# Patient Record
Sex: Female | Born: 2000 | Race: Black or African American | Hispanic: No | Marital: Single
Health system: Southern US, Community
[De-identification: ages and names within clinical notes are randomized; demographics above are authoritative.]

## PROBLEM LIST (undated history)

## (undated) DIAGNOSIS — Z789 Other specified health status: Secondary | ICD-10-CM

## (undated) HISTORY — PX: HAND SURGERY: SHX662

---
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
## 2020-05-13 ENCOUNTER — Other Ambulatory Visit: Payer: Self-pay

## 2020-05-13 ENCOUNTER — Ambulatory Visit (HOSPITAL_COMMUNITY)
Admission: EM | Admit: 2020-05-13 | Discharge: 2020-05-13 | Disposition: A | Discharge status: Home / Self Care | Payer: Medicaid Other | Attending: Emergency Medicine | Admitting: Emergency Medicine

## 2020-05-13 ENCOUNTER — Encounter (HOSPITAL_COMMUNITY): Payer: Self-pay

## 2020-05-13 DIAGNOSIS — Z3201 Encounter for pregnancy test, result positive: Secondary | ICD-10-CM

## 2020-05-13 DIAGNOSIS — R110 Nausea: Secondary | ICD-10-CM | POA: Diagnosis not present

## 2020-05-13 LAB — POC URINE PREG, ED: Preg Test, Ur: POSITIVE — AB

## 2020-05-13 MED ORDER — PRENATAL COMPLETE 14-0.4 MG PO TABS: 1.0000 | ORAL_TABLET | Freq: Every day | ORAL | 0 refills | Status: DC

## 2020-05-13 MED ORDER — DOXYLAMINE-PYRIDOXINE 10-10 MG PO TBEC: 2.0000 | DELAYED_RELEASE_TABLET | Freq: Every day | ORAL | 0 refills | Status: DC

## 2020-05-13 NOTE — ED Provider Notes (Signed)
[facility]    CSN: [id number] Arrival date & time: 05/13/20  4098      History   Chief Complaint Chief Complaint  Patient presents with  . Possible Pregnancy    HPI Cynthia Beasley is a 19 y.o. female no significant past medical history presenting today for evaluation of pregnancy test.  Patient reports that she has had 2 at home pregnancy test which are positive.  Last menstrual cycle 4/27.  She has had morning sickness.  Denies abdominal pain.  Reports prior miscarriage.  HPI  History reviewed. No pertinent past medical history.  There are no problems to display for this patient.   History reviewed. No pertinent surgical history.  OB History   No obstetric history on file.      Home Medications    Prior to Admission medications   Medication Sig Start Date End Date Taking? Authorizing Provider  Doxylamine-Pyridoxine 10-10 MG TBEC Take 2 tablets by mouth at bedtime. 05/13/20   Tezra Mahr C, PA-C  ibuprofen (ADVIL,MOTRIN) 400 MG tablet Take 1 tablet (400 mg total) by mouth every 6 (six) hours as needed for mild pain. 11/26/13   Marcellina Millin, MD  Prenatal Vit-Fe Fumarate-FA (PRENATAL COMPLETE) 14-0.4 MG TABS Take 1 tablet by mouth daily. 05/13/20   Angelamarie Avakian, Junius Creamer, PA-C    Family History History reviewed. No pertinent family history.  Social History Social History   Tobacco Use  . Smoking status: Passive Smoke Exposure - Never Smoker  . Smokeless tobacco: Never Used  Substance Use Topics  . Alcohol use: Not on file  . Drug use: Yes    Types: Marijuana     Allergies   Patient has no known allergies.   Review of Systems Review of Systems  Constitutional: Negative for fever.  Respiratory: Negative for shortness of breath.   Cardiovascular: Negative for chest pain.  Gastrointestinal: Positive for nausea. Negative for abdominal pain, diarrhea and vomiting.  Genitourinary: Positive for menstrual problem. Negative for dysuria, flank pain,  genital sores, hematuria, vaginal bleeding, vaginal discharge and vaginal pain.  Musculoskeletal: Negative for back pain.  Skin: Negative for rash.  Neurological: Negative for dizziness, light-headedness and headaches.     Physical Exam Triage Vital Signs ED Triage Vitals  Enc Vitals Group     BP 05/13/20 1025 118/69     Pulse Rate 05/13/20 1025 81     Resp 05/13/20 1025 18     Temp 05/13/20 1025 98.6 F (37 C)     Temp Source 05/13/20 1025 Oral     SpO2 05/13/20 1025 100 %     Weight --      Height --      Head Circumference --      Peak Flow --      Pain Score 05/13/20 1024 0     Pain Loc --      Pain Edu? --      Excl. in [place]? --    No data found.  Updated Vital Signs BP 118/69 (BP Location: Left Arm)   Pulse 81   Temp 98.6 F (37 C) (Oral)   Resp 18   LMP 04/15/2020   SpO2 100%   Visual Acuity Right Eye Distance:   Left Eye Distance:   Bilateral Distance:    Right Eye Near:   Left Eye Near:    Bilateral Near:     Physical Exam Vitals and nursing note reviewed.  Constitutional:      Appearance: She is  well-developed.     Comments: No acute distress  HENT:     Head: Normocephalic and atraumatic.     Nose: Nose normal.  Eyes:     Conjunctiva/sclera: Conjunctivae normal.  Cardiovascular:     Rate and Rhythm: Normal rate.  Pulmonary:     Effort: Pulmonary effort is normal. No respiratory distress.  Abdominal:     General: There is no distension.  Musculoskeletal:        General: Normal range of motion.     Cervical back: Neck supple.  Skin:    General: Skin is warm and dry.  Neurological:     Mental Status: She is alert and oriented to person, place, and time.      UC Treatments / Results  Labs (all labs ordered are listed, but only abnormal results are displayed) Labs Reviewed  POC URINE PREG, ED - Abnormal; Notable for the following components:      Result Value   Preg Test, Ur POSITIVE (*)    All other components within normal limits      EKG   Radiology No results found.  Procedures Procedures (including critical care time)  Medications Ordered in UC Medications - No data to display  Initial Impression / Assessment and Plan / UC Course  I have reviewed the triage vital signs and the nursing notes.  Pertinent labs & imaging results that were available during my care of the patient were reviewed by me and considered in my medical decision making (see chart for details).     Pregnancy test positive, initiating on prenatal, provided Diclegis to use for nausea.  Provided contact for OB/GYN follow-up.  If developing pain/bleeding to follow-up at [facility].  Discussed strict return precautions. Patient verbalized understanding and is agreeable with plan.  Final Clinical Impressions(s) / UC Diagnoses   Final diagnoses:  Positive pregnancy test  Nausea     Discharge Instructions     Pregnancy test positive, likely around 4 weeks Begin prenatal Diclegis: Two tablets at bedtime on day 1 and 2; if symptoms persist, take 1 tablet in morning and 2 tablets at bedtime on day 3; if symptoms persist, may increase to 1 tablet in morning, 1 tablet mid-afternoon, and 2 tablets at bedtime on day 4 (maximum: doxylamine 40 mg/pyridoxine 40 mg (4 tablets) per day). OR One-half of the 25 mg Unisom sleep tablet over-the-counter tablet or two chewable 5 mg tablets can be used off-label as an antiemetic. In addition, pyridoxine 25 mg, also available over-the-counter, is taken three or four times per day;This is a reasonable, less expensive substitute for combination tablets.     ED Prescriptions    Medication Sig Dispense Auth. Provider   Prenatal Vit-Fe Fumarate-FA (PRENATAL COMPLETE) 14-0.4 MG TABS Take 1 tablet by mouth daily. 60 tablet Lorian Yaun C, PA-C   Doxylamine-Pyridoxine 10-10 MG TBEC Take 2 tablets by mouth at bedtime. 60 tablet Camila Norville, [place] C, PA-C     PDMP not reviewed this encounter.    Janith Lima, PA-C 05/13/20 1147

## 2020-05-13 NOTE — ED Triage Notes (Signed)
Pt presents for pregnancy test.  

## 2020-05-13 NOTE — Discharge Instructions (Signed)
Pregnancy test positive, likely around 4 weeks Begin prenatal Diclegis: Two tablets at bedtime on day 1 and 2; if symptoms persist, take 1 tablet in morning and 2 tablets at bedtime on day 3; if symptoms persist, may increase to 1 tablet in morning, 1 tablet mid-afternoon, and 2 tablets at bedtime on day 4 (maximum: doxylamine 40 mg/pyridoxine 40 mg (4 tablets) per day). OR One-half of the 25 mg Unisom sleep tablet over-the-counter tablet or two chewable 5 mg tablets can be used off-label as an antiemetic. In addition, pyridoxine 25 mg, also available over-the-counter, is taken three or four times per day;This is a reasonable, less expensive substitute for combination tablets.

## 2020-05-28 ENCOUNTER — Ambulatory Visit (HOSPITAL_COMMUNITY)
Admission: EM | Admit: 2020-05-28 | Discharge: 2020-05-28 | Disposition: A | Discharge status: Home / Self Care | Payer: Medicaid Other | Attending: Family Medicine | Admitting: Family Medicine

## 2020-05-28 ENCOUNTER — Encounter (HOSPITAL_COMMUNITY): Payer: Self-pay

## 2020-05-28 ENCOUNTER — Other Ambulatory Visit: Payer: Self-pay

## 2020-05-28 DIAGNOSIS — M546 Pain in thoracic spine: Secondary | ICD-10-CM
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
## 2020-05-28 NOTE — Discharge Instructions (Signed)
Please try heat  Please try tylenol  Please try the exercises  Please follow up if your symptoms fail to improve.

## 2020-06-27 ENCOUNTER — Other Ambulatory Visit: Payer: Self-pay

## 2020-06-27 ENCOUNTER — Ambulatory Visit (INDEPENDENT_AMBULATORY_CARE_PROVIDER_SITE_OTHER): Payer: Medicaid Other

## 2020-06-27 VITALS — BP 110/74 | HR 91 | Ht 63.0 in | Wt 124.4 lb

## 2020-06-27 DIAGNOSIS — O3680X0 Pregnancy with inconclusive fetal viability, not applicable or unspecified: Secondary | ICD-10-CM

## 2020-06-27 DIAGNOSIS — O219 Vomiting of pregnancy, unspecified: Secondary | ICD-10-CM

## 2020-06-27 DIAGNOSIS — Z3490 Encounter for supervision of normal pregnancy, unspecified, unspecified trimester: Secondary | ICD-10-CM

## 2020-06-27 DIAGNOSIS — Z3A10 10 weeks gestation of pregnancy: Secondary | ICD-10-CM | POA: Diagnosis not present

## 2020-06-27 MED ORDER — DOXYLAMINE-PYRIDOXINE 10-10 MG PO TBEC
DELAYED_RELEASE_TABLET | ORAL | 6 refills | Status: DC
Start: 2020-06-27 — End: 2020-12-23

## 2020-06-27 MED ORDER — BLOOD PRESSURE MONITOR KIT: 1.0000 | PACK | 0 refills | Status: DC

## 2020-06-27 NOTE — Progress Notes (Signed)
I have reviewed this chart and agree with the RN/CMA assessment and management.    K. Meryl Trevyn Lumpkin, M.D. Attending [facility] (Faculty Practice)   

## 2020-06-27 NOTE — Progress Notes (Signed)
.       Subjective:   Cynthia Beasley is a 19 y.o. G1P0 at [redacted]w[redacted]d by LMP being seen today for her first obstetrical visit.  Her obstetrical history is significant for N/A. Patient does intend to breast feed. Pregnancy history fully reviewed.  Patient reports nausea and vomiting. Patient has no other concerns. Per Dr. Earlene Plater okay to send Diclegis to the pharmacy. Rx sent  HISTORY: OB History  Gravida Para Term Preterm AB Living  1 0 0 0 0 0  SAB TAB Ectopic Multiple Live Births  0 0 0 0 0    # Outcome Date GA Lbr Len/2nd Weight Sex Delivery Anes PTL Lv  1 Current            No past medical history on file. No past surgical history on file. Family History  Problem Relation Age of Onset  . Healthy Mother   . Healthy Father    Social History   Tobacco Use  . Smoking status: Passive Smoke Exposure - Never Smoker  . Smokeless tobacco: Never Used  Substance Use Topics  . Alcohol use: Never  . Drug use: Yes    Types: Marijuana   No Known Allergies Current Outpatient Medications on File Prior to Visit  Medication Sig Dispense Refill  . Prenatal 27-1 MG TABS Take 1 tablet by mouth daily.     No current facility-administered medications on file prior to visit.     Exam   Vitals:   06/27/20 0901  BP: 110/74  Pulse: 91  Weight: 124 lb 6.4 oz (56.4 kg)  Height: 5\' 3"  (1.6 m)      Uterus:     Pelvic Exam: Perineum: no hemorrhoids, normal perineum   Vulva: normal external genitalia, no lesions   Vagina:  normal mucosa, normal discharge   Cervix: no lesions and normal, pap smear done.    Adnexa: normal adnexa and no mass, fullness, tenderness   Bony Pelvis: average  System: General: well-developed, well-nourished female in no acute distress   Breast:  normal appearance, no masses or tenderness   Skin: normal coloration and turgor, no rashes   Neurologic: oriented, normal, negative, normal mood   Extremities: normal strength, tone, and muscle mass, ROM of all joints is  normal   HEENT PERRLA, extraocular movement intact and sclera clear, anicteric   Mouth/Teeth mucous membranes moist, pharynx normal without lesions and dental hygiene good   Neck supple and no masses   Cardiovascular: regular rate and rhythm   Respiratory:  no respiratory distress, normal breath sounds   Abdomen: soft, non-tender; bowel sounds normal; no masses,  no organomegaly     Assessment:   Pregnancy: G1P0 Patient Active Problem List   Diagnosis Date Noted  . Encounter for supervision of normal pregnancy, unspecified, unspecified trimester 06/27/2020     Plan:  1. Encounter for supervision of normal pregnancy, antepartum, unspecified gravidity  U/S today reveals single live intrauterine [redacted]w[redacted]d pregnancy. FHR 166

## 2020-06-30 ENCOUNTER — Telehealth: Payer: Self-pay | Admitting: Licensed Clinical Social Worker

## 2020-06-30 NOTE — Telephone Encounter (Signed)
Left message regarding scheduled appt reminder

## 2020-08-04 ENCOUNTER — Other Ambulatory Visit: Payer: Self-pay

## 2020-08-04 ENCOUNTER — Ambulatory Visit (INDEPENDENT_AMBULATORY_CARE_PROVIDER_SITE_OTHER): Payer: Medicaid Other | Admitting: Advanced Practice Midwife

## 2020-08-04 VITALS — BP 124/77 | HR 94 | Wt 119.2 lb

## 2020-08-04 DIAGNOSIS — Z3A15 15 weeks gestation of pregnancy: Secondary | ICD-10-CM | POA: Diagnosis not present

## 2020-08-04 DIAGNOSIS — Z3482 Encounter for supervision of other normal pregnancy, second trimester: Secondary | ICD-10-CM | POA: Diagnosis not present

## 2020-08-04 DIAGNOSIS — O219 Vomiting of pregnancy, unspecified: Secondary | ICD-10-CM

## 2020-08-04 DIAGNOSIS — Z3A19 19 weeks gestation of pregnancy: Secondary | ICD-10-CM

## 2020-08-04 DIAGNOSIS — Z3400 Encounter for supervision of normal first pregnancy, unspecified trimester: Secondary | ICD-10-CM

## 2020-08-04 MED ORDER — BLOOD PRESSURE KIT DEVI: 1.0000 | 0 refills | Status: DC | PRN

## 2020-08-04 MED ORDER — ASPIRIN EC 81 MG PO TBEC: 81.0000 mg | DELAYED_RELEASE_TABLET | Freq: Every day | ORAL | 11 refills | Status: DC

## 2020-08-04 NOTE — Patient Instructions (Signed)

## 2020-08-04 NOTE — Progress Notes (Signed)
NOB in office, states not feeling fetal movement yet, has occasional cramps. Intake and U/S completed on 06-28-19

## 2020-08-04 NOTE — Progress Notes (Signed)
Subjective:   Jakya Dovidio is a 19 y.o. G1P0 at 62w6dby LMP being seen today for her first obstetrical visit.  Her obstetrical history is significant for none and has Encounter for supervision of normal pregnancy, unspecified, unspecified trimester on their problem list.. Patient does intend to breast feed. Pregnancy history fully reviewed.  Patient reports nausea.  HISTORY: OB History  Gravida Para Term Preterm AB Living  1 0 0 0 0 0  SAB TAB Ectopic Multiple Live Births  0 0 0 0 0    # Outcome Date GA Lbr Len/2nd Weight Sex Delivery Anes PTL Lv  1 Current            No past medical history on file. Past Surgical History:  Procedure Laterality Date  . HAND SURGERY Left    Family History  Problem Relation Age of Onset  . Healthy Mother   . Healthy Father    Social History   Tobacco Use  . Smoking status: Passive Smoke Exposure - Never Smoker  . Smokeless tobacco: Never Used  Vaping Use  . Vaping Use: Never used  Substance Use Topics  . Alcohol use: Never  . Drug use: Not Currently    Types: Marijuana    Comment: last used 2 months ago   No Known Allergies Current Outpatient Medications on File Prior to Visit  Medication Sig Dispense Refill  . Doxylamine-Pyridoxine (DICLEGIS) 10-10 MG TBEC Take 2 tabs qhs then add 1 tab A.M and midday prn 90 tablet 6  . Prenatal 27-1 MG TABS Take 1 tablet by mouth daily.    . Blood Pressure Monitor KIT 1 kit by Does not apply route once a week. 1 kit 0   No current facility-administered medications on file prior to visit.    Indications for ASA therapy (per uptodate) One of the following: Previous pregnancy with preeclampsia, especially early onset and with an adverse outcome No Multifetal gestation No Chronic hypertension No Type 1 or 2 diabetes mellitus No Chronic kidney disease No Autoimmune disease (antiphospholipid syndrome, systemic lupus erythematosus) No  Two or more of the following: Nulliparity Yes Obesity  (body mass index >30 kg/m2) No Family history of preeclampsia in mother or sister No Age ?35 years No Sociodemographic characteristics (African American race, low socioeconomic level) Yes Personal risk factors (eg, previous pregnancy with low birth weight or small for gestational age infant, previous adverse pregnancy outcome [eg, stillbirth], interval >10 years between pregnancies) No   Indications for early 1 hour GTT (per uptodate)  BMI >25 (>23 in Asian women): No BMI 21  Exam   Vitals:   08/04/20 1309  BP: 124/77  Pulse: 94  Weight: 119 lb 3.2 oz (54.1 kg)   Fetal Heart Rate (bpm): 158  Uterus:     Pelvic Exam: Perineum: no hemorrhoids, normal perineum   Vulva: normal external genitalia, no lesions   Vagina:  normal mucosa, normal discharge   Cervix: no lesions and normal, pap smear done.    Adnexa: normal adnexa and no mass, fullness, tenderness   Bony Pelvis: average  System: General: well-developed, well-nourished female in no acute distress   Breast:  normal appearance, no masses or tenderness   Skin: normal coloration and turgor, no rashes   Neurologic: oriented, normal, negative, normal mood   Extremities: normal strength, tone, and muscle mass, ROM of all joints is normal   HEENT PERRLA, extraocular movement intact and sclera clear, anicteric   Mouth/Teeth mucous membranes moist, pharynx  normal without lesions and dental hygiene good   Neck supple and no masses   Cardiovascular: regular rate and rhythm   Respiratory:  no respiratory distress, normal breath sounds   Abdomen: soft, non-tender; bowel sounds normal; no masses,  no organomegaly     Assessment:   Pregnancy: G1P0 Patient Active Problem List   Diagnosis Date Noted  . Encounter for supervision of normal pregnancy, unspecified, unspecified trimester 06/27/2020     Plan:  1. Supervision of normal first pregnancy, antepartum --Anticipatory guidance about next visits/weeks of pregnancy given. --Next  visit in 4 weeks, virtual  --Pt meets criteria to start Baby ASA to prevent preeclampsia, discussed today and Rx sent - Culture, OB Urine - Cervicovaginal ancillary only( [place]) - Obstetric Panel, Including HIV - Hepatitis C Antibody - Genetic Screening - US MFM OB COMP + 46 WK; Future - aspirin EC 81 MG tablet; Take 1 tablet (81 mg total) by mouth daily. Swallow whole.  Dispense: 30 tablet; Refill: 11 - AFP, Serum, Open Spina Bifida  2. Nausea and vomiting during pregnancy prior to [redacted] weeks gestation --Improved, denies need for treatment at this time.  3. [redacted] weeks gestation of pregnancy   Initial labs drawn. Continue prenatal vitamins. Discussed and offered genetic screening options, including Quad screen/AFP, NIPS testing, and option to decline testing. Benefits/risks/alternatives reviewed. Pt aware that anatomy US is form of genetic screening with lower accuracy in detecting trisomies than blood work.  Pt chooses genetic screening today. NIPS: ordered. Ultrasound discussed; fetal anatomic survey: ordered. Problem list reviewed and updated. The nature of [place] with multiple MDs and other Advanced Practice Providers was explained to patient; also emphasized that residents, students are part of our team. Routine obstetric precautions reviewed. Return in about 4 weeks (around 09/01/2020).   Fatima Blank, CNM 08/04/20 1:41 PM

## 2020-08-05 ENCOUNTER — Ambulatory Visit: Payer: Medicaid Other

## 2020-08-05 LAB — OBSTETRIC PANEL, INCLUDING HIV
Antibody Screen: NEGATIVE
Basophils Absolute: 0 10*3/uL (ref 0.0–0.2)
Basos: 0 %
EOS (ABSOLUTE): 0.1 10*3/uL (ref 0.0–0.4)
Eos: 1 %
HIV Screen 4th Generation wRfx: NONREACTIVE
Hematocrit: 38.9 % (ref 34.0–46.6)
Hemoglobin: 12.8 g/dL (ref 11.1–15.9)
Hepatitis B Surface Ag: NEGATIVE
Immature Grans (Abs): 0 10*3/uL (ref 0.0–0.1)
Immature Granulocytes: 0 %
Lymphocytes Absolute: 1.4 10*3/uL (ref 0.7–3.1)
Lymphs: 23 %
MCH: 28.6 pg (ref 26.6–33.0)
MCHC: 32.9 g/dL (ref 31.5–35.7)
MCV: 87 fL (ref 79–97)
Monocytes Absolute: 0.5 10*3/uL (ref 0.1–0.9)
Monocytes: 8 %
Neutrophils Absolute: 4.2 10*3/uL (ref 1.4–7.0)
Neutrophils: 68 %
Platelets: 265 10*3/uL (ref 150–450)
RBC: 4.48 x10E6/uL (ref 3.77–5.28)
RDW: 12.9 % (ref 11.7–15.4)
RPR Ser Ql: NONREACTIVE
Rh Factor: NEGATIVE
Rubella Antibodies, IGG: <0.9 index — ABNORMAL LOW (ref >0.99)
WBC: 6.2 10*3/uL (ref 3.4–10.8)

## 2020-08-05 LAB — HEPATITIS C ANTIBODY: Hep C Virus Ab: <0.1 s/co ratio (ref 0.0–0.9)

## 2020-08-06 LAB — AFP, SERUM, OPEN SPINA BIFIDA
AFP MoM: 0.96
AFP Value: 41.8 ng/mL
Gest. Age on Collection Date: 15.9 weeks
Maternal Age At EDD: 19.6 yr
OSBR Risk 1 IN: 10000
Test Results:: NEGATIVE
Weight: 119 [lb_av]

## 2020-08-08 LAB — CULTURE, OB URINE

## 2020-08-08 LAB — URINE CULTURE, OB REFLEX

## 2020-08-11 ENCOUNTER — Encounter: Payer: Self-pay | Admitting: Advanced Practice Midwife

## 2020-08-13 ENCOUNTER — Encounter: Payer: Self-pay | Admitting: Advanced Practice Midwife

## 2020-08-16 ENCOUNTER — Encounter: Payer: Self-pay | Admitting: Advanced Practice Midwife

## 2020-08-16 DIAGNOSIS — D563 Thalassemia minor: Secondary | ICD-10-CM | POA: Insufficient documentation

## 2020-08-28 ENCOUNTER — Ambulatory Visit: Payer: Medicaid Other | Attending: Advanced Practice Midwife

## 2020-09-01 ENCOUNTER — Telehealth: Payer: Self-pay

## 2020-09-01 ENCOUNTER — Telehealth (INDEPENDENT_AMBULATORY_CARE_PROVIDER_SITE_OTHER): Payer: Medicaid Other | Admitting: Nurse Practitioner

## 2020-09-01 DIAGNOSIS — Z3400 Encounter for supervision of normal first pregnancy, unspecified trimester: Secondary | ICD-10-CM

## 2020-09-01 DIAGNOSIS — Z3A19 19 weeks gestation of pregnancy: Secondary | ICD-10-CM

## 2020-09-01 MED ORDER — BLOOD PRESSURE KIT DEVI: 1.0000 | 0 refills | Status: DC | PRN

## 2020-09-01 NOTE — Telephone Encounter (Signed)
Called pt to advise about BP cuff, babyrx, and how to re-schedule U/S. No answer, left vm to call.

## 2020-09-01 NOTE — Progress Notes (Signed)
S/w pt for virtual visit. Pt reports fetal movement, denies pain, states that she has not picked up BP cuff yet.

## 2020-09-01 NOTE — Progress Notes (Signed)
I connected with Cynthia Beasley 09/01/20 at  1:00 PM EDT by: MyChart video and verified that I am speaking with the correct person using two identifiers.  Patient is located at home and provider is located at [facility].     The purpose of this virtual visit is to provide medical care while limiting exposure to the novel coronavirus. I discussed the limitations, risks, security and privacy concerns of performing an evaluation and management service by MyChart video and the availability of in person appointments. I also discussed with the patient that there may be a patient responsible charge related to this service. By engaging in this virtual visit, you consent to the provision of healthcare.  Additionally, you authorize for your insurance to be billed for the services provided during this visit.  The patient expressed understanding and agreed to proceed.  The following staff members participated in the virtual visit:  [place], RN and Nolene Bernheim, NP    PRENATAL VISIT NOTE  Subjective:  Cynthia Beasley is a 19 y.o. G1P0 at [redacted]w[redacted]d  for phone visit for ongoing prenatal care.  She is currently monitored for the following issues for this low-risk pregnancy and has Encounter for supervision of normal pregnancy, unspecified, unspecified trimester and Alpha thalassemia silent carrier on their problem list.  Patient reports no complaints.  Contractions: Not present. Vag. Bleeding: None.  Movement: Present. Denies leaking of fluid.   The following portions of the patient's history were reviewed and updated as appropriate: allergies, current medications, past family history, past medical history, past social history, past surgical history and problem list.   Objective:  There were no vitals filed for this visit. Self-Obtained  Fetal Status:     Movement: Present     Assessment and Plan:  Pregnancy: G1P0 at [redacted]w[redacted]d 1. Supervision of normal first pregnancy, antepartum Missed anatomy scan - did not  realize she had an appointment Message in check out note to reschedule anatomy US Will bring to office for next visit - does not have BP cuff or Babyscripts  - asked RN to assist client Interested in waterbirth but also recommended signing up for childbirth, breastfeeding and waterbirth class. RN to call her to help her get onto Babyscripts and review where to pick up BP cuff.  2. [redacted] weeks gestation of pregnancy  Preterm labor symptoms and general obstetric precautions including but not limited to vaginal bleeding, contractions, leaking of fluid and fetal movement were reviewed in detail with the patient.  Return in about 4 weeks (around 09/29/2020) for in person ROB - no BP cuff.  No future appointments.   Time spent on virtual visit: 8 minutes  Currie Paris, NP

## 2020-09-01 NOTE — Patient Instructions (Signed)
[email or web address] for classes 

## 2020-09-12 ENCOUNTER — Other Ambulatory Visit: Payer: Medicaid Other

## 2020-09-15 ENCOUNTER — Other Ambulatory Visit: Payer: Medicaid Other

## 2020-09-29 ENCOUNTER — Ambulatory Visit (INDEPENDENT_AMBULATORY_CARE_PROVIDER_SITE_OTHER): Payer: Medicaid Other | Admitting: Advanced Practice Midwife

## 2020-09-29 ENCOUNTER — Other Ambulatory Visit: Payer: Self-pay

## 2020-09-29 VITALS — BP 117/71 | HR 102 | Wt 130.4 lb

## 2020-09-29 DIAGNOSIS — Z3400 Encounter for supervision of normal first pregnancy, unspecified trimester: Secondary | ICD-10-CM

## 2020-09-29 DIAGNOSIS — Z3A23 23 weeks gestation of pregnancy: Secondary | ICD-10-CM

## 2020-09-29 DIAGNOSIS — Z23 Encounter for immunization: Secondary | ICD-10-CM | POA: Diagnosis not present

## 2020-09-29 NOTE — Progress Notes (Signed)
Patient presents for ROB. She has no concerns today. Flu vaccine given.

## 2020-09-29 NOTE — Progress Notes (Signed)
   PRENATAL VISIT NOTE  Subjective:  Cynthia Beasley is a 19 y.o. G1P0 at [redacted]w[redacted]d being seen today for ongoing prenatal care.  She is currently monitored for the following issues for this low-risk pregnancy and has Encounter for supervision of normal pregnancy, unspecified, unspecified trimester and Alpha thalassemia silent carrier on their problem list.  Patient reports no complaints.  Contractions: Not present. Vag. Bleeding: None.  Movement: Present. Denies leaking of fluid.   The following portions of the patient's history were reviewed and updated as appropriate: allergies, current medications, past family history, past medical history, past social history, past surgical history and problem list.   Objective:   Vitals:   09/29/20 0946  BP: 117/71  Pulse: (!) 102  Weight: 130 lb 6.4 oz (59.1 kg)    Fetal Status: Fetal Heart Rate (bpm): 145   Movement: Present     General:  Alert, oriented and cooperative. Patient is in no acute distress.  Skin: Skin is warm and dry. No rash noted.   Cardiovascular: Normal heart rate noted  Respiratory: Normal respiratory effort, no problems with respiration noted  Abdomen: Soft, gravid, appropriate for gestational age.  Pain/Pressure: Absent     Pelvic: Cervical exam deferred        Extremities: Normal range of motion.  Edema: None  Mental Status: Normal mood and affect. Normal behavior. Normal judgment and thought content.   Assessment and Plan:  Pregnancy: G1P0 at [redacted]w[redacted]d 1. Supervision of normal first pregnancy, antepartum --Anticipatory guidance about next visits/weeks of pregnancy given. --Interest in waterbirth, discussed, questions answered.  Pt to sign up for class. Recommend childbirth classes as well.  - Flu Vaccine QUAD 36+ mos IM (Fluarix, Quad PF)  2. [redacted] weeks gestation of pregnancy   Preterm labor symptoms and general obstetric precautions including but not limited to vaginal bleeding, contractions, leaking of fluid and fetal  movement were reviewed in detail with the patient. Please refer to After Visit Summary for other counseling recommendations.   Return in about 4 weeks (around 10/27/2020).  No future appointments.  Sharen Counter, CNM

## 2020-09-29 NOTE — Patient Instructions (Addendum)
Considering Waterbirth? Guide for patients at [facility] Why consider waterbirth? . Gentle birth for babies  . Less pain medicine used in labor  . May allow for passive descent/less pushing  . May reduce perineal tears  . More mobility and instinctive maternal position changes  . Increased maternal relaxation  . Reduced blood pressure in labor   Is waterbirth safe? What are the risks of infection, drowning or other complications? . Infection:  . Very low risk (3.7 % for tub vs 4.8% for bed)  . 7 in 8000 waterbirths with documented infection  . Poorly cleaned equipment most common cause  . Slightly lower group B strep transmission rate  . Drowning  . Maternal:  . Very low risk  . Related to seizures or fainting  . Newborn:  . Very low risk. No evidence of increased risk of respiratory problems in multiple large studies  . Physiological protection from breathing under water  . Avoid underwater birth if there are any fetal complications  . Once baby's head is out of the water, keep it out.  . Birth complication  . Some reports of cord trauma, but risk decreased by bringing baby to surface gradually  . No evidence of increased risk of shoulder dystocia. Mothers can usually change positions faster in water than in a bed, possibly aiding the maneuvers to free the shoulder.  ? You must attend a Waterbirth class at [facility] [place] . 3rd Wednesday of every month from 7-9pm  . Free  . Register by calling [phone] or online at [email or web address]  . Bring us the certificate from the class to your prenatal appointment  Meet with a midwife at 36 weeks to see if you can still plan a waterbirth and to sign the consent.   If you plan a waterbirth at [facility], the following purchases are optional: . [employer] . Bathing suit top  . Long-handled mirror  .  Things that would prevent you from having a  waterbirth: . Unknown or Positive COVID-19 diagnosis upon admission to hospital  . Premature, <37wks  . Previous cesarean birth  . Presence of thick meconium-stained fluid  . Multiple gestation (Twins, triplets, etc.)  . Uncontrolled diabetes or gestational diabetes requiring medication  . Hypertension diagnosed in pregnancy or preexisting hypertension (gestational hypertension, preeclampsia, or chronic hypertension) . Heavy vaginal bleeding  . Non-reassuring fetal heart rate  . Active infection (MRSA, etc.). Group B Strep is NOT a contraindication for waterbirth.  . If your labor has to be induced and induction method requires continuous monitoring of the baby's heart rate  . Other risks/issues identified by your obstetrical provider  .  Please remember that birth is unpredictable. Under certain unforeseeable circumstances your provider may advise against giving birth in the tub. These decisions will be made on a case-by-case basis and with the safety of you and your baby as our highest priority.  **Please remember that in order to have a waterbirth, you must test Negative to COVID-19 upon admission to the hospital.**  

## 2020-10-28 ENCOUNTER — Other Ambulatory Visit: Payer: Self-pay

## 2020-10-28 ENCOUNTER — Encounter: Payer: Self-pay | Admitting: Nurse Practitioner

## 2020-10-28 ENCOUNTER — Ambulatory Visit (INDEPENDENT_AMBULATORY_CARE_PROVIDER_SITE_OTHER): Payer: Medicaid Other | Admitting: Nurse Practitioner

## 2020-10-28 ENCOUNTER — Other Ambulatory Visit: Payer: Medicaid Other

## 2020-10-28 VITALS — BP 120/74 | HR 82 | Wt 133.6 lb

## 2020-10-28 DIAGNOSIS — Z23 Encounter for immunization: Secondary | ICD-10-CM | POA: Diagnosis not present

## 2020-10-28 DIAGNOSIS — Z7189 Other specified counseling: Secondary | ICD-10-CM

## 2020-10-28 DIAGNOSIS — Z3A28 28 weeks gestation of pregnancy: Secondary | ICD-10-CM

## 2020-10-28 DIAGNOSIS — Z3403 Encounter for supervision of normal first pregnancy, third trimester: Secondary | ICD-10-CM

## 2020-10-28 DIAGNOSIS — Z3400 Encounter for supervision of normal first pregnancy, unspecified trimester: Secondary | ICD-10-CM

## 2020-10-28 NOTE — Progress Notes (Signed)
ROB 2hr GTT today.  T-Dap Given.  CC: None

## 2020-10-28 NOTE — Progress Notes (Signed)
    Subjective:  Cynthia Beasley is a 19 y.o. G1P0 at [redacted]w[redacted]d being seen today for ongoing prenatal care.  She is currently monitored for the following issues for this low-risk pregnancy and has Encounter for supervision of normal pregnancy, unspecified, unspecified trimester and Alpha thalassemia silent carrier on their problem list.  Patient reports no complaints.  Contractions: Not present. Vag. Bleeding: None.  Movement: Present. Denies leaking of fluid.   The following portions of the patient's history were reviewed and updated as appropriate: allergies, current medications, past family history, past medical history, past social history, past surgical history and problem list. Problem list updated.  Objective:   Vitals:   10/28/20 0904  BP: 120/74  Pulse: 82  Weight: 133 lb 9.6 oz (60.6 kg)    Fetal Status: Fetal Heart Rate (bpm): 154   Movement: Present     General:  Alert, oriented and cooperative. Patient is in no acute distress.  Skin: Skin is warm and dry. No rash noted.   Cardiovascular: Normal heart rate noted  Respiratory: Normal respiratory effort, no problems with respiration noted  Abdomen: Soft, gravid, appropriate for gestational age. Pain/Pressure: Absent     Pelvic:  Cervical exam deferred        Extremities: Normal range of motion.  Edema: None  Mental Status: Normal mood and affect. Normal behavior. Normal judgment and thought content.   Urinalysis:      Assessment and Plan:  Pregnancy: G1P0 at [redacted]w[redacted]d  1. Supervision of normal first pregnancy, antepartum TDAP given today Signed up for waterbirth class on Dec. 1 Recommended childbirth and breastfeeding classes also Looking for pediatrician  - Glucose Tolerance, 2 Hours w/1 Hour - CBC - RPR - HIV Antibody (routine testing w rflx)  COVID-19 Vaccine Counseling: The patient was counseled on the potential benefits and lack of known risks of COVID vaccination, during pregnancy and breastfeeding, during today's  visit. The patient's questions and concerns were addressed today, including safety of the vaccination and potential side effects as they have been published by ACOG and SMFM. The patient has been informed that there have not been any documented vaccine related injuries, deaths or birth defects to infant or mom after receiving the COVID-19 vaccine to date. The patient has been made aware that although she is not at increased risk of contracting COVID-19 during pregnancy, she is at increased risk of developing severe disease and complications if she contracts COVID-19 while pregnant. All patient questions were addressed during our visit today. The patient is still unsure of her decision for vaccination.    Preterm labor symptoms and general obstetric precautions including but not limited to vaginal bleeding, contractions, leaking of fluid and fetal movement were reviewed in detail with the patient. Please refer to After Visit Summary for other counseling recommendations.  Return in about 2 weeks (around 11/11/2020).  Nolene Bernheim, RN, MSN, NP-BC Nurse Practitioner, [facility] for [employer], [facility] 10/28/2020 12:19 PM

## 2020-10-29 LAB — CBC
Hematocrit: 35.4 % (ref 34.0–46.6)
Hemoglobin: 11.9 g/dL (ref 11.1–15.9)
MCH: 28.9 pg (ref 26.6–33.0)
MCHC: 33.6 g/dL (ref 31.5–35.7)
MCV: 86 fL (ref 79–97)
Platelets: 201 10*3/uL (ref 150–450)
RBC: 4.12 x10E6/uL (ref 3.77–5.28)
RDW: 12.4 % (ref 11.7–15.4)
WBC: 5.8 10*3/uL (ref 3.4–10.8)

## 2020-10-29 LAB — RPR: RPR Ser Ql: NONREACTIVE

## 2020-10-29 LAB — GLUCOSE TOLERANCE, 2 HOURS W/ 1HR
Glucose, 1 hour: 91 mg/dL (ref 65–179)
Glucose, 2 hour: 76 mg/dL (ref 65–152)
Glucose, Fasting: 75 mg/dL (ref 65–91)

## 2020-10-29 LAB — HIV ANTIBODY (ROUTINE TESTING W REFLEX): HIV Screen 4th Generation wRfx: NONREACTIVE

## 2020-11-11 ENCOUNTER — Ambulatory Visit (INDEPENDENT_AMBULATORY_CARE_PROVIDER_SITE_OTHER): Payer: Medicaid Other | Admitting: Certified Nurse Midwife

## 2020-11-11 ENCOUNTER — Encounter: Payer: Self-pay | Admitting: Certified Nurse Midwife

## 2020-11-11 ENCOUNTER — Other Ambulatory Visit: Payer: Self-pay

## 2020-11-11 VITALS — BP 108/67 | HR 96 | Wt 136.0 lb

## 2020-11-11 DIAGNOSIS — Z3A30 30 weeks gestation of pregnancy: Secondary | ICD-10-CM

## 2020-11-11 DIAGNOSIS — Z6791 Unspecified blood type, Rh negative: Secondary | ICD-10-CM | POA: Diagnosis not present

## 2020-11-11 DIAGNOSIS — O26899 Other specified pregnancy related conditions, unspecified trimester: Secondary | ICD-10-CM | POA: Diagnosis not present

## 2020-11-11 DIAGNOSIS — Z3400 Encounter for supervision of normal first pregnancy, unspecified trimester: Secondary | ICD-10-CM

## 2020-11-11 MED ORDER — RHO D IMMUNE GLOBULIN 1500 UNIT/2ML IJ SOSY
300.0000 ug | PREFILLED_SYRINGE | Freq: Once | INTRAMUSCULAR | Status: AC
  Administered 2020-11-11: 300 ug via INTRAMUSCULAR

## 2020-11-11 NOTE — Patient Instructions (Signed)
Considering Waterbirth? Guide for patients at [facility] ([facility]) Why consider waterbirth? . Gentle birth for babies  . Less pain medicine used in labor  . May allow for passive descent/less pushing  . May reduce perineal tears  . More mobility and instinctive maternal position changes  . Increased maternal relaxation   Is waterbirth safe? What are the risks of infection, drowning or other complications? . Infection:  . Very low risk (3.7 % for tub vs 4.8% for bed)  . 7 in 8000 waterbirths with documented infection  . Poorly cleaned equipment most common cause  . Slightly lower group B strep transmission rate  . Drowning  . Maternal:  . Very low risk  . Related to seizures or fainting  . Newborn:  . Very low risk. No evidence of increased risk of respiratory problems in multiple large studies  . Physiological protection from breathing under water  . Avoid underwater birth if there are any fetal complications  . Once baby's head is out of the water, keep it out.  . Birth complication  . Some reports of cord trauma, but risk decreased by bringing baby to surface gradually  . No evidence of increased risk of shoulder dystocia. Mothers can usually change positions faster in water than in a bed, possibly aiding the maneuvers to free the shoulder.   There are 2 things you MUST do to have a waterbirth with [facility]: 1. Attend a waterbirth class at [facility] [place] [facility]   a. 3rd Wednesday of every month from 7-9 pm (virtual during COVID) b. Free c. Register by calling [phone] or register online at [email or web address] d. Bring us the certificate from the class to your prenatal appointment or send via MyChart 2. Meet with a midwife at 36 weeks* to see if you can still plan a waterbirth and to sign the consent.   *We also recommend that you schedule as many of your prenatal visits with a midwife as possible.    Helpful information: . You may  want to bring a bathing suit top to the hospital to wear during labor but this is optional.  All other supplies are provided by the hospital. . Please arrive at the hospital with signs of active labor, and do not wait at home until late in labor. It takes 45 min- 2 hours for COVID testing, fetal monitoring, and check in to your room to take place, plus transport and filling of the waterbirth tub.    Things that would prevent you from having a waterbirth: . Unknown or Positive COVID-19 diagnosis upon admission to hospital* . Premature, <37wks  . Previous cesarean birth  . Presence of thick meconium-stained fluid  . Multiple gestation (Twins, triplets, etc.)  . Uncontrolled diabetes or gestational diabetes requiring medication  . Hypertension diagnosed in pregnancy or preexisting hypertension (gestational hypertension, preeclampsia, or chronic hypertension) . Heavy vaginal bleeding  . Non-reassuring fetal heart rate  . Active infection (MRSA, etc.). Group B Strep is NOT a contraindication for waterbirth.  . If your labor has to be induced and induction method requires continuous monitoring of the baby's heart rate  . Other risks/issues identified by your obstetrical provider   Please remember that birth is unpredictable. Under certain unforeseeable circumstances your provider may advise against giving birth in the tub. These decisions will be made on a case-by-case basis and with the safety of you and your baby as our highest priority.   *Please remember that in order   to have a waterbirth, you must test Negative to COVID-19 upon admission to the hospital.  Updated 11/04/2020  

## 2020-11-11 NOTE — Progress Notes (Signed)
   PRENATAL VISIT NOTE  Subjective:  Cynthia Beasley is a 19 y.o. G1P0 at [redacted]w[redacted]d being seen today for ongoing prenatal care.  She is currently monitored for the following issues for this low-risk pregnancy and has Encounter for supervision of normal pregnancy, unspecified, unspecified trimester; Alpha thalassemia silent carrier; and Rh negative state in antepartum period on their problem list.  Patient reports no complaints.  Contractions: Not present. Vag. Bleeding: None.  Movement: Present. Denies leaking of fluid.   The following portions of the patient's history were reviewed and updated as appropriate: allergies, current medications, past family history, past medical history, past social history, past surgical history and problem list.   Objective:   Vitals:   11/11/20 0956  BP: 108/67  Pulse: 96  Weight: 136 lb (61.7 kg)    Fetal Status: Fetal Heart Rate (bpm): 155 Fundal Height: 28 cm Movement: Present     General:  Alert, oriented and cooperative. Patient is in no acute distress.  Skin: Skin is warm and dry. No rash noted.   Cardiovascular: Normal heart rate noted  Respiratory: Normal respiratory effort, no problems with respiration noted  Abdomen: Soft, gravid, appropriate for gestational age.  Pain/Pressure: Absent     Pelvic: Cervical exam deferred        Extremities: Normal range of motion.  Edema: None  Mental Status: Normal mood and affect. Normal behavior. Normal judgment and thought content.   Assessment and Plan:  Pregnancy: G1P0 at [redacted]w[redacted]d 1. Supervision of normal first pregnancy, antepartum - Patient doing well, no complaints or concerns at this time  - routine prenatal care - anticipatory guidance on upcoming appointments  - encouraged patient to go to [place] appointment as scheduled - had to be rescheduled 3 times   2. [redacted] weeks gestation of pregnancy  3. Rh negative state in antepartum period - educated and discussed rhogam injections  - rho (d) immune globulin  (RHIG/RHOPHYLAC) injection 300 mcg  Preterm labor symptoms and general obstetric precautions including but not limited to vaginal bleeding, contractions, leaking of fluid and fetal movement were reviewed in detail with the patient. Please refer to After Visit Summary for other counseling recommendations.   Return in about 2 weeks (around 11/25/2020) for LROB, in person.  Future Appointments  Date Time Provider Department Center  11/20/2020  1:45 PM [facility]  11/25/2020  8:55 AM Armando Reichert, CNM [facility] None    Sharyon Cable, CNM

## 2020-11-11 NOTE — Progress Notes (Signed)
Rhogam given in Left Ventrogluteal, tolerated well.  Administrations This Visit    rho (d) immune globulin (RHIG/RHOPHYLAC) injection 300 mcg    Admin Date 11/11/2020 Action Given Dose 300 mcg Route Intramuscular Administered By Maretta Bees, RMA

## 2020-11-20 ENCOUNTER — Ambulatory Visit: Payer: Medicaid Other | Attending: Advanced Practice Midwife

## 2020-11-20 ENCOUNTER — Other Ambulatory Visit: Payer: Self-pay

## 2020-11-20 ENCOUNTER — Other Ambulatory Visit: Payer: Self-pay | Admitting: Advanced Practice Midwife

## 2020-11-20 ENCOUNTER — Ambulatory Visit: Payer: Medicaid Other | Admitting: *Deleted

## 2020-11-20 ENCOUNTER — Encounter: Payer: Self-pay | Admitting: *Deleted

## 2020-11-20 DIAGNOSIS — Z363 Encounter for antenatal screening for malformations: Secondary | ICD-10-CM

## 2020-11-20 DIAGNOSIS — Z6791 Unspecified blood type, Rh negative: Secondary | ICD-10-CM

## 2020-11-20 DIAGNOSIS — D563 Thalassemia minor: Secondary | ICD-10-CM

## 2020-11-20 DIAGNOSIS — O26899 Other specified pregnancy related conditions, unspecified trimester: Secondary | ICD-10-CM | POA: Diagnosis present

## 2020-11-20 DIAGNOSIS — O360130 Maternal care for anti-D [Rh] antibodies, third trimester, not applicable or unspecified: Secondary | ICD-10-CM | POA: Diagnosis not present

## 2020-11-20 DIAGNOSIS — Z148 Genetic carrier of other disease: Secondary | ICD-10-CM

## 2020-11-20 DIAGNOSIS — Z3A31 31 weeks gestation of pregnancy: Secondary | ICD-10-CM

## 2020-11-21 ENCOUNTER — Other Ambulatory Visit: Payer: Self-pay | Admitting: *Deleted

## 2020-11-21 DIAGNOSIS — Z362 Encounter for other antenatal screening follow-up: Secondary | ICD-10-CM

## 2020-11-25 ENCOUNTER — Encounter: Payer: Medicaid Other | Admitting: Advanced Practice Midwife

## 2020-11-25 DIAGNOSIS — Z3A32 32 weeks gestation of pregnancy: Secondary | ICD-10-CM

## 2020-12-20 NOTE — L&D Delivery Note (Signed)
OB/GYN Faculty Practice Delivery Note  Cynthia Beasley is a 20 y.o. G1P0 s/p SVD at [redacted]w[redacted]d. She was admitted for labor.   ROM: rupture date, rupture time, delivery date, or delivery time have not been documented with meconium stained fluid GBS Status:  Positive/-- (01/04 0214) Maximum Maternal Temperature: 98  Labor Progress: . Initial SVE: 4cm. She then progressed to complete.   Delivery Date/Time: 2/6 at 22:01  Delivery: Called to room and patient was complete and pushing. Head delivered LOA while on hands and knees. Right shoulder caught behind pubis bone. Shoulder dystocia called- unable to deliver posterior arm in hands and knees. Patient flipped over to back. Posterior arm delivered and then body delivered in usual fashion. Infant with spontaneous cry, placed on mother's abdomen, dried and stimulated. Cord clamped x 2 after 1-minute delay, and cut by FOB. Cord blood drawn. Placenta delivered spontaneously with gentle cord traction. Fundus firm with massage and Pitocin. Labia, perineum, vagina, and cervix inspected inspected with second degree perineal laceration, repaired in routine fashion.  Baby Weight: pending  Placenta: Sent to L&D Complications: 60 second shoulder dystocia Lacerations: second degree, repaired EBL: 100 mL Analgesia: local lidocaine and IV fentanyl for repair    Infant:  APGAR (1 MIN): 8   APGAR (5 MINS): 9   APGAR (10 MINS):     Casper Harrison, MD [facility] Fellow, [facility] for [facility], [facility] 01/25/2021, 10:42 PM

## 2020-12-22 ENCOUNTER — Ambulatory Visit: Payer: Medicaid Other

## 2020-12-23 ENCOUNTER — Other Ambulatory Visit (HOSPITAL_COMMUNITY)
Admission: RE | Admit: 2020-12-23 | Discharge: 2020-12-23 | Disposition: A | Discharge status: Home / Self Care | Payer: Medicaid Other | Source: Ambulatory Visit | Attending: Obstetrics & Gynecology | Admitting: Obstetrics & Gynecology

## 2020-12-23 ENCOUNTER — Ambulatory Visit (INDEPENDENT_AMBULATORY_CARE_PROVIDER_SITE_OTHER): Payer: Medicaid Other | Admitting: Obstetrics & Gynecology

## 2020-12-23 ENCOUNTER — Encounter: Payer: Self-pay | Admitting: Obstetrics & Gynecology

## 2020-12-23 ENCOUNTER — Other Ambulatory Visit: Payer: Self-pay

## 2020-12-23 VITALS — BP 111/72 | HR 99 | Wt 136.1 lb

## 2020-12-23 DIAGNOSIS — Z6791 Unspecified blood type, Rh negative: Secondary | ICD-10-CM | POA: Diagnosis not present

## 2020-12-23 DIAGNOSIS — Z3400 Encounter for supervision of normal first pregnancy, unspecified trimester: Secondary | ICD-10-CM

## 2020-12-23 DIAGNOSIS — Z3A36 36 weeks gestation of pregnancy: Secondary | ICD-10-CM | POA: Diagnosis not present

## 2020-12-23 DIAGNOSIS — D563 Thalassemia minor: Secondary | ICD-10-CM | POA: Diagnosis not present

## 2020-12-23 DIAGNOSIS — O26893 Other specified pregnancy related conditions, third trimester: Secondary | ICD-10-CM | POA: Diagnosis present

## 2020-12-23 DIAGNOSIS — O26899 Other specified pregnancy related conditions, unspecified trimester: Secondary | ICD-10-CM

## 2020-12-23 NOTE — Progress Notes (Signed)
Pt reports fetal movement, denies pain.  

## 2020-12-23 NOTE — Progress Notes (Addendum)
   PRENATAL VISIT NOTE  Subjective:  Cynthia Beasley is a 20 y.o. G1P0 at [redacted]w[redacted]d being seen today for ongoing prenatal care.  She is currently monitored for the following issues for this low-risk pregnancy and has Encounter for supervision of normal pregnancy, unspecified, unspecified trimester; Alpha thalassemia silent carrier; and Rh negative state in antepartum period on their problem list.  Patient reports no complaints.  Contractions: Not present. Vag. Bleeding: None.  Movement: Present. Denies leaking of fluid.   The following portions of the patient's history were reviewed and updated as appropriate: allergies, current medications, past family history, past medical history, past social history, past surgical history and problem list.   Objective:   Vitals:   12/23/20 1340  BP: 111/72  Pulse: 99  Weight: 136 lb 1.6 oz (61.7 kg)    Fetal Status: Fetal Heart Rate (bpm): 146   Movement: Present     General:  Alert, oriented and cooperative. Patient is in no acute distress.  Skin: Skin is warm and dry. No rash noted.   Cardiovascular: Normal heart rate noted  Respiratory: Normal respiratory effort, no problems with respiration noted  Abdomen: Soft, gravid, appropriate for gestational age.  Pain/Pressure: Absent     Pelvic: Cervical exam deferred        Extremities: Normal range of motion.  Edema: None  Mental Status: Normal mood and affect. Normal behavior. Normal judgment and thought content.   Assessment and Plan:  Pregnancy: G1P0 at [redacted]w[redacted]d 1. Supervision of normal first pregnancy, antepartum - return 1 weeks - would like water birth.  appt will be scheduled with CNM.  She has completed class.  2. [redacted] weeks gestation of pregnancy - Strep Gp B NAA - Cervicovaginal ancillary only( [place])  3. Alpha thalassemia silent carrier - declined genetic counseling  4. Rh negative state in antepartum period - Rhogam given 11/11/2020  Preterm labor symptoms and general obstetric  precautions including but not limited to vaginal bleeding, contractions, leaking of fluid and fetal movement were reviewed in detail with the patient. Please refer to After Visit Summary for other counseling recommendations.   Return in about 1 week (around 12/30/2020).  Future Appointments  Date Time Provider Department Center  12/29/2020  1:45 PM [facility] NURSE [facility]  12/29/2020  2:00 PM [facility] US[facility]    Jerene Bears, MD

## 2020-12-24 LAB — CERVICOVAGINAL ANCILLARY ONLY
Chlamydia: NEGATIVE
Comment: NEGATIVE
Comment: NORMAL
Neisseria Gonorrhea: NEGATIVE

## 2020-12-25 LAB — STREP GP B NAA: Strep Gp B NAA: POSITIVE — AB

## 2020-12-29 ENCOUNTER — Encounter: Payer: Self-pay | Admitting: *Deleted

## 2020-12-29 ENCOUNTER — Ambulatory Visit: Payer: Medicaid Other | Attending: Obstetrics and Gynecology | Admitting: *Deleted

## 2020-12-29 ENCOUNTER — Ambulatory Visit (HOSPITAL_BASED_OUTPATIENT_CLINIC_OR_DEPARTMENT_OTHER): Payer: Medicaid Other

## 2020-12-29 ENCOUNTER — Other Ambulatory Visit: Payer: Self-pay

## 2020-12-29 DIAGNOSIS — Z3A36 36 weeks gestation of pregnancy: Secondary | ICD-10-CM

## 2020-12-29 DIAGNOSIS — O360130 Maternal care for anti-D [Rh] antibodies, third trimester, not applicable or unspecified: Secondary | ICD-10-CM | POA: Insufficient documentation

## 2020-12-29 DIAGNOSIS — Z362 Encounter for other antenatal screening follow-up: Secondary | ICD-10-CM | POA: Diagnosis not present

## 2020-12-29 DIAGNOSIS — O26899 Other specified pregnancy related conditions, unspecified trimester: Secondary | ICD-10-CM

## 2020-12-29 DIAGNOSIS — O360190 Maternal care for anti-D [Rh] antibodies, unspecified trimester, not applicable or unspecified: Secondary | ICD-10-CM

## 2020-12-29 DIAGNOSIS — Z6791 Unspecified blood type, Rh negative: Secondary | ICD-10-CM

## 2021-01-01 ENCOUNTER — Telehealth (INDEPENDENT_AMBULATORY_CARE_PROVIDER_SITE_OTHER): Payer: Medicaid Other | Admitting: Certified Nurse Midwife

## 2021-01-01 ENCOUNTER — Encounter: Payer: Self-pay | Admitting: Certified Nurse Midwife

## 2021-01-01 ENCOUNTER — Other Ambulatory Visit: Payer: Self-pay

## 2021-01-01 VITALS — BP 105/64 | HR 99

## 2021-01-01 DIAGNOSIS — Z6791 Unspecified blood type, Rh negative: Secondary | ICD-10-CM

## 2021-01-01 DIAGNOSIS — O360130 Maternal care for anti-D [Rh] antibodies, third trimester, not applicable or unspecified: Secondary | ICD-10-CM

## 2021-01-01 DIAGNOSIS — Z3400 Encounter for supervision of normal first pregnancy, unspecified trimester: Secondary | ICD-10-CM

## 2021-01-01 DIAGNOSIS — Z3A37 37 weeks gestation of pregnancy: Secondary | ICD-10-CM

## 2021-01-01 NOTE — Patient Instructions (Signed)
  Cervical Ripening (to get your cervix ready for labor) : May try one or all:  Red Raspberry Leaf capsules:  two 300mg  or 400mg  tablets with each meal, 2-3 times a day  Potential Side Effects Of Raspberry Leaf:  Most women do not experience any side effects from drinking raspberry leaf tea. However, nausea and loose stools are possible   Evening Primrose Oil capsules: may take 1 to 3 capsules daily. May also prick one to release the oil and insert it into your vagina at night.  Some of the potential side effects:  Upset stomach  Loose stools or diarrhea  Headaches  Nausea  4 Dates a day (may taste better if warmed in microwave until soft). Found where raisins are in the grocery store  Intercourse, walking, squats, lunges

## 2021-01-01 NOTE — Progress Notes (Signed)
OBSTETRICS PRENATAL VIRTUAL VISIT ENCOUNTER NOTE  [facility]   I connected with Cynthia Beasley on 01/01/21 at  9:22 AM EST by MyChart Video Encounter at home and verified that I am speaking with the correct person using two identifiers.   I discussed the limitations, risks, security and privacy concerns of performing an evaluation and management service virtually and the availability of in person appointments. I also discussed with the patient that there may be a patient responsible charge related to this service. The patient expressed understanding and agreed to proceed. Subjective:  Cynthia Beasley is a 20 y.o. G1P0 at 7420w2d being seen today for ongoing prenatal care.  She is currently monitored for the following issues for this low-risk pregnancy and has Encounter for supervision of normal pregnancy, unspecified, unspecified trimester; Alpha thalassemia silent carrier; and Rh negative state in antepartum period on their problem list.  Patient reports no complaints.  Contractions: Not present. Vag. Bleeding: None.  Movement: Present. Denies any leaking of fluid.   The following portions of the patient's history were reviewed and updated as appropriate: allergies, current medications, past family history, past medical history, past social history, past surgical history and problem list.   Objective:   Vitals:   01/01/21 0839  BP: 105/64  Pulse: 99    Fetal Status:     Movement: Present     General:  Alert, oriented and cooperative. Patient is in no acute distress.  Respiratory: Normal respiratory effort, no problems with respiration noted  Mental Status: Normal mood and affect. Normal behavior. Normal judgment and thought content.  Rest of physical exam deferred due to type of encounter  Imaging: US MFM OB FOLLOW UP  Result Date: 12/29/2020 ----------------------------------------------------------------------  OBSTETRICS REPORT                        (Signed Final 12/29/2020 03:07 pm) ---------------------------------------------------------------------- Patient Info  ID #:       [id number]                          D.O.B.:  2001-06-12 (19 yrs)  Name:       Cynthia PresserZANIYA Beasley                    Visit Date: 12/29/2020 02:15 pm ---------------------------------------------------------------------- Performed By  Attending:        Ma RingsVictor Fang MD         Ref. Address:     [address]                                                             [address]  Performed By:     Jenel Luckshelsea Shortridge     [facility]         [facility]  Referred By:      Conan Bowens                    MD ---------------------------------------------------------------------- Orders  #  Description                           Code        Ordered By  1  [place] MFM OB FOLLOW UP                   [phone]    YU FANG ----------------------------------------------------------------------  #  Order #                     Accession #                Episode #  1  [id number]                   [id number]                 [id number] ---------------------------------------------------------------------- Indications  Rh negative state in antepartum                O36.0190  [redacted] weeks gestation of pregnancy                Z3A.36  Genetic carrier (Silent Alpha Thal Carrier)    Z14.8 ---------------------------------------------------------------------- Fetal Evaluation  Num Of Fetuses:         1  Fetal Heart Rate(bpm):  155  Cardiac Activity:       Observed  Presentation:           Cephalic  Placenta:               Posterior  P. Cord Insertion:      Previously Visualized  Amniotic Fluid  AFI FV:      Within normal limits  AFI Sum(cm)     %Tile       Largest Pocket(cm)  16.7            63          5.62   RUQ(cm)       RLQ(cm)       LUQ(cm)        LLQ(cm)  2.42          3.83          5.62           4.83 ---------------------------------------------------------------------- Biometry  BPD:      85.5  mm     G. Age:  34w 3d          9  %    CI:        72.49   %    70 - 86  FL/HC:      22.3   %    20.8 - 22.6  HC:      319.4  mm     G. Age:  36w 0d         11  %    HC/AC:      0.90        0.92 - 1.05  AC:      355.1  mm     G. Age:  39w 3d       > 99  %    FL/BPD:     83.4   %    71 - 87  FL:       71.3  mm     G. Age:  36w 4d         43  %    FL/AC:      20.1   %    20 - 24  Est. FW:    3297  gm      7 lb 4 oz     81  % ---------------------------------------------------------------------- Gestational Age  U/S Today:     36w 4d                                        EDD:   01/22/21  Best:          36w 5d     Det. By:  U/S C R L  (06/27/20)    EDD:   01/21/21 ---------------------------------------------------------------------- Anatomy  Cranium:               Appears normal         Aortic Arch:            Appears normal  Cavum:                 Previously seen        Ductal Arch:            Not well visualized  Ventricles:            Previously seen        Diaphragm:              Appears normal  Choroid Plexus:        Not well visualized    Stomach:                Appears normal, left                                                                        sided  Cerebellum:            Previously seen        Abdomen:                Appears normal  Posterior Fossa:       Previously seen        Abdominal Wall:         Not well visualized  Nuchal Fold:           Not applicable (>20    Cord Vessels:  Previously seen                         wks GA)  Face:                  Orbits nl; profile not Kidneys:                Previously seen                         well visualized  Lips:                  Previously seen        Bladder:                Appears normal   Thoracic:              Appears normal         Spine:                  Limited views                                                                        appear normal  Heart:                 Previously seen        Upper Extremities:      Previously seen  RVOT:                  Not well visualized    Lower Extremities:      Previously seen  LVOT:                  Appears normal  Other:  Technically difficult due to advanced gestational age. Technically          difficult due to fetal position. ---------------------------------------------------------------------- Cervix Uterus Adnexa  Cervix  Not visualized (advanced GA >24wks)  Uterus  No abnormality visualized.  Right Ovary  Not visualized.  Left Ovary  Not visualized. ---------------------------------------------------------------------- Comments  This patient was seen for a follow up growth scan as she  presented late for care.  She denies any problems since her  last exam.  She was informed that the fetal growth and amniotic fluid  level appears appropriate for her gestational age.  The views of the fetal anatomy were limited today due to her  advanced gestational age.  As the fetal growth is within normal limits, no further exams  were scheduled in our office. ----------------------------------------------------------------------                   Ma Rings, MD Electronically Signed Final Report   12/29/2020 03:07 pm ----------------------------------------------------------------------   Assessment and Plan:  Pregnancy: G1P0 at [redacted]w[redacted]d 1. Rh negative state in antepartum period - Rhogam PP   2. Supervision of normal first pregnancy, antepartum - Patient doing well, no complaints  - Routine prenatal care  - Anticipatory guidance on upcoming appointments   3. [redacted] weeks gestation of pregnancy - labor precautions and reasons to present to MAU - discussed with patient location of MAU and where to present for labor precautions  -  Discussed  recommendations of antibiotics during pregnancy due to positive GBS    Term labor symptoms and general obstetric precautions including but not limited to vaginal bleeding, contractions, leaking of fluid and fetal movement were reviewed in detail with the patient. I discussed the assessment and treatment plan with the patient. The patient was provided an opportunity to ask questions and all were answered. The patient agreed with the plan and demonstrated an understanding of the instructions. The patient was advised to call back or seek an in-person office evaluation/[facility] for any urgent or concerning symptoms. Please refer to After Visit Summary for other counseling recommendations.   I provided 10 minutes of face-to-face time during this encounter.  Return in about 11 days (around 01/12/2021) for LROB, in person, with midwife -cervical examination and WB consent.   Sharyon CableVeronica C Renly Guedes, CNM Center for [facility], [facility]

## 2021-01-01 NOTE — Progress Notes (Signed)
I connected with  Volney Presser on 01/01/21 by a video enabled telemedicine application and verified that I am speaking with the correct person using two identifiers.   I discussed the limitations of evaluation and management by telemedicine. The patient expressed understanding and agreed to proceed.  PATIENT: HOME PROVIDER: [facility]  MyChart OB, reports no problems today.

## 2021-01-25 ENCOUNTER — Encounter (HOSPITAL_COMMUNITY): Payer: Self-pay | Admitting: Obstetrics and Gynecology

## 2021-01-25 ENCOUNTER — Other Ambulatory Visit: Payer: Self-pay

## 2021-01-25 ENCOUNTER — Inpatient Hospital Stay (HOSPITAL_COMMUNITY)
Admission: AD | Admit: 2021-01-25 | Discharge: 2021-01-27 | DRG: 805 | Disposition: A | Discharge status: Home / Self Care | Payer: Medicaid Other | Attending: Obstetrics and Gynecology | Admitting: Obstetrics and Gynecology

## 2021-01-25 ENCOUNTER — Inpatient Hospital Stay (HOSPITAL_BASED_OUTPATIENT_CLINIC_OR_DEPARTMENT_OTHER): Payer: Medicaid Other

## 2021-01-25 DIAGNOSIS — O360130 Maternal care for anti-D [Rh] antibodies, third trimester, not applicable or unspecified: Secondary | ICD-10-CM | POA: Diagnosis not present

## 2021-01-25 DIAGNOSIS — Z3490 Encounter for supervision of normal pregnancy, unspecified, unspecified trimester: Secondary | ICD-10-CM

## 2021-01-25 DIAGNOSIS — Z148 Genetic carrier of other disease: Secondary | ICD-10-CM | POA: Diagnosis not present

## 2021-01-25 DIAGNOSIS — Z6791 Unspecified blood type, Rh negative: Secondary | ICD-10-CM

## 2021-01-25 DIAGNOSIS — U071 COVID-19: Secondary | ICD-10-CM | POA: Diagnosis present

## 2021-01-25 DIAGNOSIS — O9852 Other viral diseases complicating childbirth: Secondary | ICD-10-CM | POA: Diagnosis present

## 2021-01-25 DIAGNOSIS — O99824 Streptococcus B carrier state complicating childbirth: Secondary | ICD-10-CM | POA: Diagnosis present

## 2021-01-25 DIAGNOSIS — O26893 Other specified pregnancy related conditions, third trimester: Secondary | ICD-10-CM | POA: Diagnosis present

## 2021-01-25 DIAGNOSIS — O480 Post-term pregnancy: Secondary | ICD-10-CM

## 2021-01-25 DIAGNOSIS — D563 Thalassemia minor: Secondary | ICD-10-CM | POA: Diagnosis present

## 2021-01-25 DIAGNOSIS — Z3A40 40 weeks gestation of pregnancy: Secondary | ICD-10-CM

## 2021-01-25 DIAGNOSIS — Z0371 Encounter for suspected problem with amniotic cavity and membrane ruled out: Secondary | ICD-10-CM

## 2021-01-25 DIAGNOSIS — O352XX0 Maternal care for (suspected) hereditary disease in fetus, not applicable or unspecified: Secondary | ICD-10-CM | POA: Diagnosis not present

## 2021-01-25 DIAGNOSIS — Z880 Allergy status to penicillin: Secondary | ICD-10-CM | POA: Diagnosis not present

## 2021-01-25 DIAGNOSIS — O98513 Other viral diseases complicating pregnancy, third trimester: Secondary | ICD-10-CM | POA: Diagnosis present

## 2021-01-25 DIAGNOSIS — O479 False labor, unspecified: Secondary | ICD-10-CM

## 2021-01-25 DIAGNOSIS — O26899 Other specified pregnancy related conditions, unspecified trimester: Secondary | ICD-10-CM

## 2021-01-25 HISTORY — DX: Other specified health status: Z78.9

## 2021-01-25 LAB — CBC
HCT: 41 % (ref 36.0–46.0)
Hemoglobin: 12.8 g/dL (ref 12.0–15.0)
MCH: 25.2 pg — ABNORMAL LOW (ref 26.0–34.0)
MCHC: 31.2 g/dL (ref 30.0–36.0)
MCV: 80.7 fL (ref 80.0–100.0)
Platelets: 177 10*3/uL (ref 150–400)
RBC: 5.08 MIL/uL (ref 3.87–5.11)
RDW: 14.6 % (ref 11.5–15.5)
WBC: 6.6 10*3/uL (ref 4.0–10.5)
nRBC: 0 % (ref 0.0–0.2)

## 2021-01-25 LAB — URINALYSIS, ROUTINE W REFLEX MICROSCOPIC
Bilirubin Urine: NEGATIVE
Glucose, UA: NEGATIVE mg/dL
Hgb urine dipstick: NEGATIVE
Ketones, ur: NEGATIVE mg/dL
Leukocytes,Ua: NEGATIVE
Nitrite: NEGATIVE
Protein, ur: NEGATIVE mg/dL
Specific Gravity, Urine: 1.008 (ref 1.005–1.030)
pH: 6 (ref 5.0–8.0)

## 2021-01-25 LAB — SARS CORONAVIRUS 2 BY RT PCR (HOSPITAL ORDER, PERFORMED IN ~~LOC~~ HOSPITAL LAB): SARS Coronavirus 2: POSITIVE — AB

## 2021-01-25 LAB — TYPE AND SCREEN
ABO/RH(D): A NEG
Antibody Screen: NEGATIVE

## 2021-01-25 LAB — POCT FERN TEST: POCT Fern Test: NEGATIVE

## 2021-01-25 MED ORDER — OXYTOCIN BOLUS FROM INFUSION
333.0000 mL | Freq: Once | INTRAVENOUS | Status: AC
  Administered 2021-01-25: 333 mL via INTRAVENOUS

## 2021-01-25 MED ORDER — OXYTOCIN-SODIUM CHLORIDE 30-0.9 UT/500ML-% IV SOLN
2.5000 [IU]/h | INTRAVENOUS | Status: DC
  Administered 2021-01-25: 2.5 [IU]/h via INTRAVENOUS
  Filled 2021-01-25: qty 500

## 2021-01-25 MED ORDER — SOD CITRATE-CITRIC ACID 500-334 MG/5ML PO SOLN: 30.0000 mL | ORAL | Status: DC | PRN

## 2021-01-25 MED ORDER — FENTANYL CITRATE (PF) 100 MCG/2ML IJ SOLN: 100.0000 ug | Freq: Once | INTRAMUSCULAR | Status: DC

## 2021-01-25 MED ORDER — OXYCODONE-ACETAMINOPHEN 5-325 MG PO TABS: 2.0000 | ORAL_TABLET | ORAL | Status: DC | PRN

## 2021-01-25 MED ORDER — LIDOCAINE HCL (PF) 1 % IJ SOLN
30.0000 mL | INTRAMUSCULAR | Status: AC | PRN
Start: 2021-01-25 — End: 2021-01-25
  Administered 2021-01-25: 30 mL via SUBCUTANEOUS
  Filled 2021-01-25: qty 30

## 2021-01-25 MED ORDER — FENTANYL CITRATE (PF) 100 MCG/2ML IJ SOLN
INTRAMUSCULAR | Status: AC
  Administered 2021-01-25: 100 ug
  Filled 2021-01-25: qty 2

## 2021-01-25 MED ORDER — ONDANSETRON HCL 4 MG/2ML IJ SOLN: 4.0000 mg | Freq: Four times a day (QID) | INTRAMUSCULAR | Status: DC | PRN

## 2021-01-25 MED ORDER — ACETAMINOPHEN 325 MG PO TABS: 650.0000 mg | ORAL_TABLET | ORAL | Status: DC | PRN

## 2021-01-25 MED ORDER — LACTATED RINGERS IV SOLN: INTRAVENOUS | Status: DC

## 2021-01-25 MED ORDER — OXYCODONE-ACETAMINOPHEN 5-325 MG PO TABS: 1.0000 | ORAL_TABLET | ORAL | Status: DC | PRN

## 2021-01-25 MED ORDER — LACTATED RINGERS IV SOLN: 500.0000 mL | INTRAVENOUS | Status: DC | PRN

## 2021-01-25 MED ORDER — PENICILLIN G POT IN DEXTROSE 60000 UNIT/ML IV SOLN
3.0000 10*6.[IU] | INTRAVENOUS | Status: DC
Start: 2021-01-26 — End: 2021-01-26

## 2021-01-25 MED ORDER — SODIUM CHLORIDE 0.9 % IV SOLN
5.0000 10*6.[IU] | Freq: Once | INTRAVENOUS | Status: AC
  Administered 2021-01-25: 5 10*6.[IU] via INTRAVENOUS
  Filled 2021-01-25: qty 5

## 2021-01-25 NOTE — MAU Note (Signed)
Fern negative confirmation per [place] CNM-pt for [place] for AFI

## 2021-01-25 NOTE — MAU Provider Note (Addendum)
Chief Complaint  Patient presents with  . Check Up     First Provider Initiated Contact with Patient 01/25/2021 at 1716.   S: Cynthia Beasley  is a 20 y.o. y.o. year old G1P0 female at [redacted]w[redacted]d weeks gestation who presents to MAU requesting checkup and reporting one episode of leaking clear fluid 0900.   Contractions: denies Vaginal bleeding: Denies Fetal movement: Nml  Gets prenatal care at [facility]. Last visit 01/01/21. Next appt scheduled 01/27/21. No IOL scheduled. Planning waterbirth.   O:  Patient Vitals for the past 24 hrs:  BP Temp Temp src Pulse Resp SpO2 Height Weight  01/25/21 1732 126/74 - - 83 17 - - -  01/25/21 1653 124/78 98.5 F (36.9 C) Oral 90 16 - - -  01/25/21 1538 122/75 - - 97 17 - - -  01/25/21 1454 131/75 - - 100 20 - - -  01/25/21 1411 135/76 98.4 F (36.9 C) Oral 100 18 99 % - -  01/25/21 1404 - - - - - - 5\' 3"  (1.6 m) 66.1 kg   General: NAD Heart: Regular rate Lungs: Normal rate and effort Abd: Soft, NT, Gravid, S=D Pelvic: NEFG, Neg pooling, no blood.  Dilation: 4 Effacement (%): 90 Cervical Position: Posterior Station: -2 Presentation: Vertex Exam by:: [email or web address] CNM  EFM: 145, Moderate variability, 15 x 15 accelerations, no decelerations Toco: Q 3-9, mild (pt unaware)  Neg Fern x 2  MDM:  No cervical change over 3.5 hours. Pt only reports mild cramping. Offered admission today, declined. Recommended IOL at 41 weeks. Pt declined. FHR reactive today. Will check AFI to complete post-dates testing.    AFI 14. No fluid in fetal stomach. Discussed w/ Dr. Dorathy Kinsman, MFM who reviewed current and previous Judeth Cornfield and stated that it is not concerning and not an indication for induction. Discussed w/ Dr. [place]. Agrees w/ POC. OK for D/C and F/U in two days.   A: [redacted]w[redacted]d week IUP No evidence of SROM or labor FHR reactive  P: Discharge home in stable condition. Labor precautions and fetal kick counts. Follow-up as scheduled for prenatal visit 01/27/21 or sooner  as needed if symptoms worsen. Return to maternity admissions as needed if symptoms worsen.  03/27/21, Katrinka Blazing, CNM 01/25/2021 7:11 PM  2  Addendum:  After patient discharged, CNM in room to complete waterbirth consent. During consent process, pt breathing with contractions and unable to talk every 3-5 minutes.  Pt declined cervical exam and desired discharge.  Waterbirth consent completed. Pt support person stepped out of room and reported that she was unable to walk out of MAU due to painful contractions and asked for a wheelchair.  CNM returned to room and offered cervical exam again. Pt consented and cervix 6/90/-2, vertex.    Admission to L&D for active labor at term GBS positive, PCN ordered for prophylaxis COVID swab pending No barriers to waterbirth at this time  03/25/2021, CNM 8:07 PM

## 2021-01-25 NOTE — Discharge Summary (Addendum)
Postpartum Discharge Summary     Patient Name: Cynthia Beasley DOB: 14-Nov-2001 MRN: [id number]  Date of admission: 01/25/2021 Delivery date:01/25/2021  Delivering provider: Janet Berlin  Date of discharge: 01/27/2021  Admitting diagnosis: Normal labor [O80, Z37.9] Intrauterine pregnancy: [redacted]w[redacted]d    Secondary diagnosis:  Active Problems:   Encounter for supervision of normal pregnancy, unspecified, unspecified trimester   Alpha thalassemia silent carrier   Rh negative state in antepartum period   Normal labor   Shoulder dystocia during labor and delivery   Vaginal delivery   COVID-19 affecting pregnancy in third trimester  Additional problems: none    Discharge diagnosis: Term Pregnancy Delivered                                              Post partum procedures:N/A Augmentation: N/A Complications: Shoulder Dystocia   Hospital course: Onset of Labor With Vaginal Delivery      20y.o. yo G1P0 at 441w5das admitted in Active Labor on 01/25/2021. Patient had an uncomplicated labor course as follows:  Membrane Rupture Time/Date: 9:49 PM ,01/25/2021   Delivery Method:Vaginal, Spontaneous  Episiotomy: None  Lacerations:  2nd degree  Patient had an uncomplicated postpartum course.  She is ambulating, tolerating a regular diet, passing flatus, and urinating well. Patient is discharged home in stable condition on 01/27/21.  Newborn Data: Birth date:01/25/2021  Birth time:10:01 PM  Gender:Female  Living status:Living  Apgars:8 ,9  Weight:3830 g   Magnesium Sulfate received: No BMZ received: No Rhophylac:Yes MMR:N/A T-DaP:Given prenatally Flu: Yes Transfusion:No  Physical exam  Vitals:   01/26/21 1030 01/26/21 1403 01/26/21 2018 01/27/21 0525  BP: 110/71 110/74 130/79 125/75  Pulse: 67 87 69 60  Resp: 17 17 16 17   Temp: (!) 97.5 F (36.4 C) 98.3 F (36.8 C) 98 F (36.7 C) 98.5 F (36.9 C)  TempSrc: Oral Oral Oral Oral  SpO2:   99% 100%  Weight:      Height:        General: alert, cooperative and no distress Lochia: appropriate Uterine Fundus: firm Incision: N/A DVT Evaluation: No evidence of DVT seen on physical exam. Labs: Lab Results  Component Value Date   WBC 6.6 01/25/2021   HGB 12.8 01/25/2021   HCT 41.0 01/25/2021   MCV 80.7 01/25/2021   PLT 177 01/25/2021   No flowsheet data found. Edinburgh Score: Edinburgh Postnatal Depression Scale Screening Tool 01/26/2021  I have been able to laugh and see the funny side of things. 0  I have looked forward with enjoyment to things. 0  I have blamed myself unnecessarily when things went wrong. 0  I have been anxious or worried for no good reason. 0  I have felt scared or panicky for no good reason. 0  Things have been getting on top of me. 1  I have been so unhappy that I have had difficulty sleeping. 0  I have felt sad or miserable. 0  I have been so unhappy that I have been crying. 0  The thought of harming myself has occurred to me. 0  Edinburgh Postnatal Depression Scale Total 1     After visit meds:  Allergies as of 01/27/2021   No Known Allergies     Medication List    STOP taking these medications   aspirin EC 81 MG tablet  TAKE these medications   acetaminophen 500 MG tablet Commonly known as: TYLENOL Take 2 tablets (1,000 mg total) by mouth every 6 (six) hours as needed (for pain scale < 4).   Blood Pressure Monitor Kit 1 kit by Does not apply route once a week.   Blood Pressure Kit Devi 1 kit by Does not apply route as needed. Large cuff   coconut oil Oil Apply 1 application topically as needed.   ibuprofen 600 MG tablet Commonly known as: ADVIL Take 1 tablet (600 mg total) by mouth every 6 (six) hours as needed.   norethindrone 0.35 MG tablet Commonly known as: Ortho Micronor Take 1 tablet (0.35 mg total) by mouth daily.   Prenatal 27-1 MG Tabs Take 1 tablet by mouth daily.        Discharge home in stable condition Infant Feeding: Breast Infant  Disposition:home with mother Discharge instruction: per After Visit Summary and Postpartum booklet. Activity: Advance as tolerated. Pelvic rest for 6 weeks.  Diet: routine diet Future Appointments: Future Appointments  Date Time Provider [place]  02/23/2021  3:00 PM [place], Kathie Dike, CNM [place] None   Follow up Visit:  [place] Follow up on 02/23/2021.   Why: for postpartum visit Contact information: [place] [id number] [facility] Follow up.   Specialty: Obstetrics and Gynecology Why: as needed for labor, vaginal bleeding, leaking of fluid, decreased fetal movement or other pregnancy emergencies.  Contact information: [address] [id number] [place] [phone]               Please schedule this patient for a virtual postpartum visit in 6 weeks with the following provider:  Any provider. Additional Postpartum F/U:none Low risk pregnancy complicated by: covid in third trimester Delivery mode:  Vaginal, Spontaneous  Anticipated Birth Control:  POPs, rx sent at discharge.   03/25/5034 Arrie Senate, MD

## 2021-01-25 NOTE — MAU Note (Signed)
Pt reports contractions remain mild and non painful.

## 2021-01-25 NOTE — MAU Note (Signed)
While completing discharge paperwork and water birth consent. Pt reports contractions are increasingly more painful. Pt agreeable to SVE -6cm dilatated-pt agreeable with admission.

## 2021-01-25 NOTE — Lactation Note (Signed)
This note was copied from a baby's chart. Lactation Consultation Note Baby 1 hr old. Mom trying to latch when LC entered rm. Mom + Covid. Hand expression taught w/dot of colostrum noted. Breast tissue soft. Mom denied painful latch.  Will f/u MBU.   Patient Name: Cynthia Beasley TFTDD'U Date: 01/25/2021 Reason for consult: L&D Initial assessment;Primapara;Term Age:20 hours  Maternal Data Has patient been taught Hand Expression?: Yes Does the patient have breastfeeding experience prior to this delivery?: No  Feeding    LATCH Score Latch: Grasps breast easily, tongue down, lips flanged, rhythmical sucking.  Audible Swallowing: None  Type of Nipple: Everted at rest and after stimulation  Comfort (Breast/Nipple): Soft / non-tender  Hold (Positioning): Assistance needed to correctly position infant at breast and maintain latch.  LATCH Score: 7   Lactation Tools Discussed/Used    Interventions Interventions: Breast feeding basics reviewed;Support pillows;Assisted with latch;Skin to skin;Breast massage;Hand express;Breast compression;Adjust position  Discharge    Consult Status Consult Status: Follow-up Date: 01/25/21 Follow-up type: In-patient    Charyl Dancer 01/25/2021, 11:41 PM

## 2021-01-25 NOTE — MAU Note (Signed)
Presents stating she lost her mucous plug and wants a check up.  Has appt scheduled for Tuesday.  Endorses +FM.

## 2021-01-25 NOTE — H&P (Signed)
HPI: Cynthia Beasley is a 20 y.o. year old G65P0 female at 76w5dweeks gestation who presents to [facility] for check-up and R/O ROM. No cervical change over 3.5 hours and ROM rulled out, but pt got much more uncomfortable while being D/C'd and was found to be 6 cm.   No vaginal bleeding  Nursing Staff Provider  Office Location  [facility] Dating  LMP  Language  English Anatomy [place] wnl  Flu Vaccine  Given 09/29/20 Genetic Screen  NIPS: low risk female  AFP:  negative  TDaP vaccine    10/28/20 Hgb A1C or  GTT  Third trimester  Glucose, Fasting 65 - 91 mg/dL 75   Glucose, 1 hour 65 - 179 mg/dL 91   Glucose, 2 hour 65 - 152 mg/dL 76      Rhogam   11/11/2020   LAB RESULTS   COVID declined Blood Type A/Negative/-- (08/16 1354)   Feeding Plan Breast Antibody Negative (08/16 1354)  Contraception Pill? Rubella <0.90 (08/16 1354)  Circumcision Yes RPR Non Reactive (11/09 1007)   Pediatrician  Undecided HBsAg Negative (08/16 1354)   Support Person Corey-FOB and Monique-Mom HCVAb Negative  Prenatal Classes  HIV Non Reactive (11/09 1007)     BTL Consent  GBS  (For PCN allergy, check sensitivities)   VBAC Consent  Pap NA    Hgb Electro    BP Cuff Sent 06/27/20 CF     SMA     Waterbirth  _0  Class _1  Consent _2  CNM visit    Induction  _3  Orders Entered _4 Foley Y/N    OB History    Gravida  1   Para      Term      Preterm      AB      Living        SAB      IAB      Ectopic      Multiple      Live Births             Past Medical History:  Diagnosis Date  . Medical history non-contributory    Past Surgical History:  Procedure Laterality Date  . HAND SURGERY Left   . HAND SURGERY Left 2019   Family History: family history includes Healthy in her father and mother. Social History:  reports that she is a non-smoker but has been exposed to tobacco smoke. She has never used smokeless tobacco. She reports previous drug use. Drug: Marijuana. She reports that she does not drink  alcohol.     Maternal Diabetes: No Genetic Screening: Normal Maternal Ultrasounds/Referrals: Other: Fetal Ultrasounds or other Referrals:  Other:  [place]2/6/22 possible prominent bowel per MFM.  Maternal Substance Abuse:  No Significant Maternal Medications:  None Significant Maternal Lab Results:  Group B Strep positive and Rh negative Other Comments:  None  Review of Systems  Constitutional: Negative for chills and fever.  Eyes: Negative for visual disturbance.  Respiratory: Negative for cough.   Gastrointestinal: Positive for abdominal pain. Negative for nausea and vomiting.  Genitourinary: Positive for vaginal discharge (mucus plug). Negative for vaginal bleeding.  Musculoskeletal: Negative for back pain.  Neurological: Negative for headaches.   Maternal Medical History:  Reason for admission: Contractions.  Nausea.  Contractions: Frequency: irregular.   Perceived severity is moderate.    Fetal activity: Perceived fetal activity is normal.    Prenatal complications: No bleeding, PIH, IUGR, oligohydramnios, polyhydramnios, pre-eclampsia or substance  abuse.   Prenatal Complications - Diabetes: none.    Dilation: 6 Effacement (%): 90 Station: -2 Exam by:: Lis Leftwich-Kirby CNM Blood pressure 126/71, pulse 80, temperature 97.9 F (36.6 C), temperature source Oral, resp. rate 16, height _0  (1.6 m), weight 66.1 kg, last menstrual period 04/15/2020, SpO2 99 %. Maternal Exam:  Uterine Assessment: Contraction strength is moderate.  Contraction frequency is irregular.   Abdomen: Patient reports no abdominal tenderness. Estimated fetal weight is 8 lb.   Fetal presentation: vertex  Introitus: Normal vulva. Normal vagina.  Pelvis: adequate for delivery.   Cervix: Cervix evaluated by sterile speculum exam and digital exam.     Fetal Exam Fetal Monitor Review: Baseline rate: 145.  Variability: moderate (6-25 bpm).   Pattern: accelerations present and no decelerations.     Fetal State Assessment: Category I - tracings are normal.     Physical Exam Constitutional:      General: She is in acute distress.     Appearance: Normal appearance. She is not ill-appearing.  Eyes:     Conjunctiva/sclera: Conjunctivae normal.  Cardiovascular:     Rate and Rhythm: Normal rate.  Pulmonary:     Effort: Pulmonary effort is normal. No respiratory distress.  Abdominal:     Tenderness: There is no abdominal tenderness.  Genitourinary:    General: Normal vulva.  Musculoskeletal:     Right lower leg: No edema.     Left lower leg: No edema.  Skin:    General: Skin is warm and dry.  Neurological:     Mental Status: She is alert and oriented to person, place, and time.  Psychiatric:        Mood and Affect: Mood normal.     Prenatal labs: ABO, Rh: A/Negative/-- (08/16 1354) Antibody: Negative (08/16 1354) Rubella: <0.90 (08/16 1354) RPR: Non Reactive (11/09 1007)  HBsAg: Negative (08/16 1354)  HIV: Non Reactive (11/09 1007)  GBS: Positive/-- (01/04 0214)   Assessment: 1. Labor: Active 2. Fetal Wellbeing: Category I  3. Pain Control: Plans waterbirth. Consent on chart. Went to class.  4. GBS: pos 5. 40.5 week IUP 6. Rubella non-immune 7. RH neg  Plan:  1. Admit to BS per consult with MD 2. Routine L&D orders 3. Analgesia/anesthesia PRN  4. PCN for GCS prophylaxis 5. May proceed with water immersion if Covid neg. 6. Offer MMR PP 7. Rhophylac PRN  Manya Silvas 01/25/2021, 8:10 PM

## 2021-01-25 NOTE — Lactation Note (Addendum)
Lactation Consultation Note Baby 1 hr old. Mom trying to latch when LC entered rm. Mom + Covid. Hand expression taught w/dot of colostrum noted. Breast tissue soft. Mom denied painful latch.  Will f/u [facility]BU.  Patient Name: Mylani Gentry [id number] Date: 01/25/2021 Reason for consult: L&D Initial assessment;Primapara;Term Age:20 y.o.  Maternal Data Has patient been taught Hand Expression?: Yes Does the patient have breastfeeding experience prior to this delivery?: No  Feeding    LATCH Score Latch: Grasps breast easily, tongue down, lips flanged, rhythmical sucking.  Audible Swallowing: None  Type of Nipple: Everted at rest and after stimulation  Comfort (Breast/Nipple): Soft / non-tender  Hold (Positioning): Assistance needed to correctly position infant at breast and maintain latch.  LATCH Score: 7   Lactation Tools Discussed/Used    Interventions Interventions: Support pillows;Assisted with latch;Breast feeding basics reviewed;Skin to skin;Breast massage;Hand express;Breast compression;Adjust position  Discharge [facility] Program: Yes  Consult Status Consult Status: Follow-up Date: 01/26/21 Follow-up type: In-patient    Charyl Dancer 01/25/2021, 11:37 PM

## 2021-01-26 LAB — RPR: RPR Ser Ql: NONREACTIVE

## 2021-01-26 MED ORDER — SENNOSIDES-DOCUSATE SODIUM 8.6-50 MG PO TABS
2.0000 | ORAL_TABLET | ORAL | Status: DC
  Administered 2021-01-26 (×2): 2 via ORAL
  Filled 2021-01-26 (×2): qty 2

## 2021-01-26 MED ORDER — DOCUSATE SODIUM 100 MG PO CAPS
100.0000 mg | ORAL_CAPSULE | Freq: Two times a day (BID) | ORAL | Status: DC
  Administered 2021-01-26 – 2021-01-27 (×3): 100 mg via ORAL
  Filled 2021-01-26 (×3): qty 1

## 2021-01-26 MED ORDER — DIBUCAINE (PERIANAL) 1 % EX OINT: 1.0000 "application " | TOPICAL_OINTMENT | CUTANEOUS | Status: DC | PRN

## 2021-01-26 MED ORDER — ONDANSETRON HCL 4 MG PO TABS: 4.0000 mg | ORAL_TABLET | ORAL | Status: DC | PRN

## 2021-01-26 MED ORDER — TETANUS-DIPHTH-ACELL PERTUSSIS 5-2.5-18.5 LF-MCG/0.5 IM SUSY: 0.5000 mL | PREFILLED_SYRINGE | Freq: Once | INTRAMUSCULAR | Status: DC

## 2021-01-26 MED ORDER — RHO D IMMUNE GLOBULIN 1500 UNIT/2ML IJ SOSY
300.0000 ug | PREFILLED_SYRINGE | Freq: Once | INTRAMUSCULAR | Status: AC
  Administered 2021-01-26: 300 ug via INTRAVENOUS
  Filled 2021-01-26: qty 2

## 2021-01-26 MED ORDER — ACETAMINOPHEN 325 MG PO TABS: 650.0000 mg | ORAL_TABLET | ORAL | Status: DC | PRN

## 2021-01-26 MED ORDER — PRENATAL MULTIVITAMIN CH
1.0000 | ORAL_TABLET | Freq: Every day | ORAL | Status: DC
  Administered 2021-01-27: 1 via ORAL
  Filled 2021-01-26: qty 1

## 2021-01-26 MED ORDER — DIPHENHYDRAMINE HCL 25 MG PO CAPS: 25.0000 mg | ORAL_CAPSULE | Freq: Four times a day (QID) | ORAL | Status: DC | PRN

## 2021-01-26 MED ORDER — BENZOCAINE-MENTHOL 20-0.5 % EX AERO: 1.0000 "application " | INHALATION_SPRAY | CUTANEOUS | Status: DC | PRN

## 2021-01-26 MED ORDER — IBUPROFEN 600 MG PO TABS
600.0000 mg | ORAL_TABLET | Freq: Four times a day (QID) | ORAL | Status: DC
  Administered 2021-01-26 – 2021-01-27 (×5): 600 mg via ORAL
  Filled 2021-01-26 (×5): qty 1

## 2021-01-26 MED ORDER — COCONUT OIL OIL
1.0000 | TOPICAL_OIL | Status: DC | PRN
Start: 2021-01-26 — End: 2021-01-27

## 2021-01-26 MED ORDER — SIMETHICONE 80 MG PO CHEW: 80.0000 mg | CHEWABLE_TABLET | ORAL | Status: DC | PRN

## 2021-01-26 MED ORDER — ONDANSETRON HCL 4 MG/2ML IJ SOLN: 4.0000 mg | INTRAMUSCULAR | Status: DC | PRN

## 2021-01-26 MED ORDER — WITCH HAZEL-GLYCERIN EX PADS: 1.0000 "application " | MEDICATED_PAD | CUTANEOUS | Status: DC | PRN

## 2021-01-26 NOTE — Lactation Note (Signed)
This note was copied from a baby's chart. Lactation Consultation Note Baby 7 hrs. LC asked mom if she would like to see LC at this time. Mom stated not right now, baby is sleeping come back later.  Patient Name: Cynthia Beasley [id number] Date: 01/26/2021   Age:20 hours  Maternal Data    Feeding    LATCH Score Latch: Too sleepy or reluctant, no latch achieved, no sucking elicited.  Audible Swallowing: None  Type of Nipple: Everted at rest and after stimulation  Comfort (Breast/Nipple): Soft / non-tender  Hold (Positioning): Full assist, staff holds infant at breast  [facility] Score: 4   Lactation Tools Discussed/Used    Interventions    Discharge    Consult Status      Charyl Dancer 01/26/2021, 5:51 AM

## 2021-01-26 NOTE — Progress Notes (Addendum)
POSTPARTUM PROGRESS NOTE  Subjective: Wynona Duhamel is a 20 y.o. G1P1001 s/p SVD at [redacted]w[redacted]d.  She reports she doing well. No acute events overnight. She denies any problems with ambulating, voiding or po intake. Denies nausea or vomiting. Pain is moderately controlled.  Lochia is minimal.  Objective: Blood pressure 110/72, pulse 82, temperature 98.9 F (37.2 C), temperature source Oral, resp. rate 16, height 5\' 3"  (1.6 m), weight 66.1 kg, last menstrual period 04/15/2020, SpO2 100 %, unknown if currently breastfeeding.  Physical Exam:  General: alert, cooperative and no distress Chest: no respiratory distress Abdomen: soft, non-tender  Uterine Fundus: firm and at level of umbilicus Extremities: No calf swelling or tenderness  no edema  Recent Labs    01/25/21 1958  HGB 12.8  HCT 41.0    Assessment/Plan: Deepa Barthel is a 20 y.o. G1P1001 s/p SVD at [redacted]w[redacted]d for active labor.   Routine Postpartum Care: Doing well, pain well-controlled.  -- Continue routine care, lactation support  -- Contraception: OCPs -- Feeding: bottle   Dispo: Plan for discharge 01/27/21.  03/27/21, DO OB Fellow, Faculty [facility] 01/26/2021 8:27 AM  Attestation of Supervision of Student:  I confirm that I have verified the information documented in the  resident's   student's note and that I have also personally reperformed the history, physical exam and all medical decision making activities.  I have verified that all services and findings are accurately documented in this student's note; and I agree with management and plan as outlined in the documentation. I have also made any necessary editorial changes.   03/26/2021, CNM [facility], [facility] 01/26/2021 11:19 AM

## 2021-01-27 ENCOUNTER — Encounter: Payer: Medicaid Other | Admitting: Advanced Practice Midwife

## 2021-01-27 LAB — RH IG WORKUP (INCLUDES ABO/RH)
ABO/RH(D): A NEG
Fetal Screen: NEGATIVE
Gestational Age(Wks): 40.5
Unit division: 0

## 2021-01-27 MED ORDER — IBUPROFEN 600 MG PO TABS: 600.0000 mg | ORAL_TABLET | Freq: Four times a day (QID) | ORAL | 0 refills | Status: DC | PRN

## 2021-01-27 MED ORDER — COCONUT OIL OIL: 1.0000 "application " | TOPICAL_OIL | 0 refills | Status: DC | PRN

## 2021-01-27 MED ORDER — ACETAMINOPHEN 500 MG PO TABS: 1000.0000 mg | ORAL_TABLET | Freq: Four times a day (QID) | ORAL | Status: DC | PRN

## 2021-01-27 MED ORDER — NORETHINDRONE 0.35 MG PO TABS: 1.0000 | ORAL_TABLET | Freq: Every day | ORAL | 11 refills | Status: AC

## 2021-01-27 NOTE — Discharge Instructions (Signed)
-take tylenol 1000 mg every 6 hours as needed for pain, alternate with ibuprofen 600 mg every 6 hours -drink plenty of water to help with breastfeeding -continue prenatal vitamins while you are breastfeeding -think about birth control options-->bedisider.org is a great website! You can get any form of birth control from the health department for free -make sure you take your birth control pills at the same time everyday!   Postpartum Care After Vaginal Delivery The following information offers guidance about how to care for yourself from the time you deliver your baby to 6-12 weeks after delivery (postpartum period). If you have problems or questions, contact your health care provider for more specific instructions. Follow these instructions at home: Vaginal bleeding  It is normal to have vaginal bleeding (lochia) after delivery. Wear a sanitary pad for bleeding and discharge. ? During the first week after delivery, the amount and appearance of lochia is often similar to a menstrual period. ? Over the next few weeks, it will gradually decrease to a dry, yellow-Lang discharge. ? For most women, lochia stops completely by 4-6 weeks after delivery, but can vary.  Change your sanitary pads frequently. Watch for any changes in your flow, such as: ? A sudden increase in volume. ? A change in color. ? Large blood clots.  If you pass a blood clot from your vagina, save it and call your health care provider. Do not flush blood clots down the toilet before talking with your health care provider.  Do not use tampons or douches until your health care provider approves.  If you are not breastfeeding, your period should return 6-8 weeks after delivery. If you are feeding your baby breast milk only, your period may not return until you stop breastfeeding. Perineal care  Keep the area between the vagina and the anus (perineum) clean and dry. Use medicated pads and pain-relieving sprays and creams as  directed.  If you had a surgical cut in the perineum (episiotomy) or a tear, check the area for signs of infection until you are healed. Check for: ? More redness, swelling, or pain. ? Fluid or blood coming from the cut or tear. ? Warmth. ? Pus or a bad smell.  You may be given a squirt bottle to use instead of wiping to clean the perineum area after you use the bathroom. Pat the area gently to dry it.  To relieve pain caused by an episiotomy, a tear, or swollen veins in the anus (hemorrhoids), take a warm sitz bath 2-3 times a day. In a sitz bath, the warm water should only come up to your hips and cover your buttocks.   Breast care  In the first few days after delivery, your breasts may feel heavy, full, and uncomfortable (breast engorgement). Milk may also leak from your breasts. Ask your health care provider about ways to help relieve the discomfort.  If you are breastfeeding: ? Wear a bra that supports your breasts and fits well. Use breast pads to absorb milk that leaks. ? Keep your nipples clean and dry. Apply creams and ointments as told. ? You may have uterine contractions every time you breastfeed for up to several weeks after delivery. This helps your uterus return to its normal size. ? If you have any problems with breastfeeding, notify your health care provider or lactation consultant.  If you are not breastfeeding: ? Avoid touching your breasts. Do not squeeze out (express) milk. Doing this can make your breasts produce more milk. ?  Wear a good-fitting bra and use cold packs to help with swelling. Intimacy and sexuality  Ask your health care provider when you can engage in sexual activity. This may depend upon: ? Your risk of infection. ? How fast you are healing. ? Your comfort and desire to engage in sexual activity.  You are able to get pregnant after delivery, even if you have not had your period. Talk with your health care provider about methods of birth control  (contraception) or family planning if you desire future pregnancies. Medicines  Take over-the-counter and prescription medicines only as told by your health care provider.  Take an over-the-counter stool softener to help ease bowel movements as told by your health care provider.  If you were prescribed an antibiotic medicine, take it as told by your health care provider. Do not stop taking the antibiotic even if you start to feel better.  Review all previous and current prescriptions to check for possible transfer into breast milk. Activity  Gradually return to your normal activities as told by your health care provider.  Rest as much as possible. Nap while your baby is sleeping. Eating and drinking  Drink enough fluid to keep your urine pale yellow.  To help prevent or relieve constipation, eat high-fiber foods every day.  Choose healthy eating to support breastfeeding or weight loss goals.  Take your prenatal vitamins until your health care provider tells you to stop.   General tips/recommendations  Do not use any products that contain nicotine or tobacco. These products include cigarettes, chewing tobacco, and vaping devices, such as e-cigarettes. If you need help quitting, ask your health care provider.  Do not drink alcohol, especially if you are breastfeeding.  Do not take medications or drugs that are not prescribed to you, especially if you are breastfeeding.  Visit your health care provider for a postpartum checkup within the first 3-6 weeks after delivery.  Complete a comprehensive postpartum visit no later than 12 weeks after delivery.  Keep all follow-up visits for you and your baby. Contact a health care provider if:  You feel unusually sad or worried.  Your breasts become red, painful, or hard.  You have a fever or other signs of an infection.  You have bleeding that is soaking through one pad an hour or you have blood clots.  You have a severe headache  that doesn't go away or you have vision changes.  You have nausea and vomiting and are unable to eat or drink anything for 24 hours. Get help right away if:  You have chest pain or difficulty breathing.  You have sudden, severe leg pain.  You faint or have a seizure.  You have thoughts about hurting yourself or your baby. If you ever feel like you may hurt yourself or others, or have thoughts about taking your own life, get help right away. Go to your nearest emergency department or:  Call your local emergency services (911 in the [place]  The National Suicide Prevention Lifeline at 202-297-1234. This suicide crisis helpline is open 24 hours a day.  Text the Crisis Text Line at 707-440-1690 (in the [place] Summary  The period of time after you deliver your newborn up to 6-12 weeks after delivery is called the postpartum period.  Keep all follow-up visits for you and your baby.  Review all previous and current prescriptions to check for possible transfer into breast milk.  Contact a health care provider if you feel unusually sad or worried during  the postpartum period. This information is not intended to replace advice given to you by your health care provider. Make sure you discuss any questions you have with your health care provider. Document Revised: 08/21/2020 Document Reviewed: 08/21/2020 Elsevier Patient Education  2021 [employer].

## 2021-01-28 ENCOUNTER — Ambulatory Visit: Payer: Self-pay

## 2021-01-28 NOTE — Lactation Note (Signed)
This note was copied from a baby's chart. Lactation Consultation Note  Patient Name: Cynthia Beasley [id number] Date: 01/28/2021 Reason for consult: Follow-up assessment;Primapara;1st time breastfeeding;Term;Infant weight loss Age:20 hours  Baby fussy when LC entered the room .  LC changed a large loose stool diaper, transitioning.  Baby rooting, and mom latched the baby and LC noted it to be shallow, added pillow support and ease down the chin to increase the depth and noted increased swallows.  Per mom breast are fuller and heavier . LC showed mom breast compressions and reviewed basics.  See below for breast feeding tools and education during consult.  Mom denies soreness and feels the baby is latching well.    Maternal Data    Feeding Mother's Current Feeding Choice: Breast Milk  LATCH Score                    Lactation Tools Discussed/Used Tools: Pump;Flanges Flange Size: 27;24 Breast pump type: Manual Pump Education: Milk Storage;Setup, frequency, and cleaning Reason for Pumping: PRN  Interventions    Discharge Discharge Education: Engorgement and breast care;Warning signs for feeding baby Pump: Personal;Manual WIC Program: Yes  Consult Status Consult Status: Complete Date: 01/28/21    Kathrin Greathouse 01/28/2021, 11:21 AM

## 2021-01-28 NOTE — Lactation Note (Signed)
This note was copied from a baby's chart. Lactation Consultation Note  Patient Name: Cynthia Beasley [id number] Date: 01/28/2021   Age:20 hours LC entered the room mom and infant asleep at this time. Maternal Data    Feeding    LATCH Score                    Lactation Tools Discussed/Used    Interventions    Discharge    Consult Status      Danelle Earthly 01/28/2021, 1:59 AM

## 2021-02-13 ENCOUNTER — Telehealth: Payer: Self-pay | Admitting: Licensed Clinical Social Worker

## 2021-02-13 NOTE — Telephone Encounter (Signed)
Subjective: Cynthia Beasley is a G1P1001 completed contraception counseling.  She does not have a history of any mental health concerns. She is not currently sexually active. She is currently using pills for birth control. Patient states mother as her support system. Cynthia Beasley reports she is adjusting well with newborn and does not have any immediate needs   Birth Control History:  pills  MDM Patient counseled on all options for birth control today including LARC. Patient will continue to use pills for birth control.    Assessment:  20 y.o. female desires pills for birth control  Plan: No further plan   Cynthia Beasley, Cynthia Beasley 02/13/2021 11:41 AM

## 2021-02-23 ENCOUNTER — Ambulatory Visit: Payer: Medicaid Other | Admitting: Advanced Practice Midwife

## 2022-02-26 ENCOUNTER — Ambulatory Visit (HOSPITAL_COMMUNITY): Payer: Medicaid Other

## 2022-04-21 IMAGING — US US OB LIMITED
1 series · 10 of 10 positions shown · non-contrast
Comparison: none

[Series 1: us ob limited · 10 of 10 slices shown]
[im 1/10]
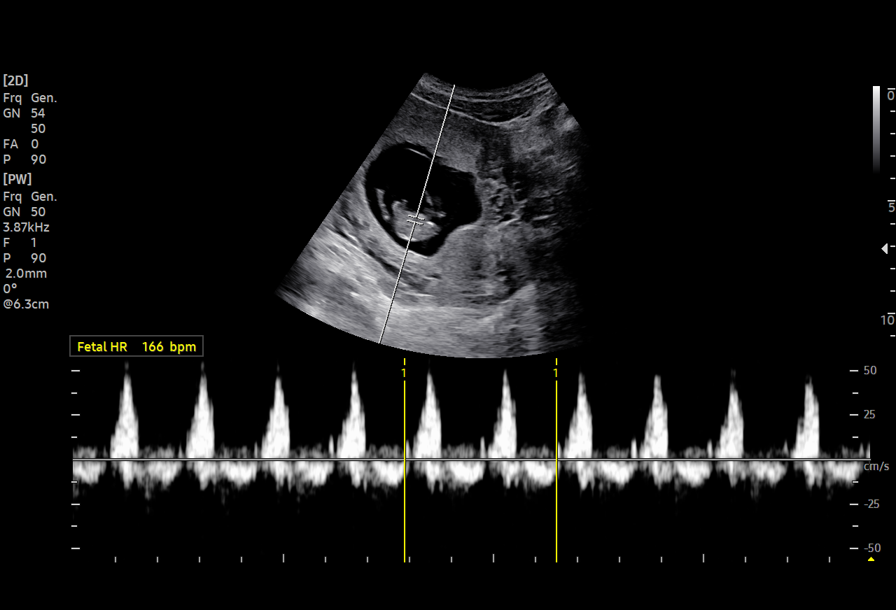
[im 2/10]
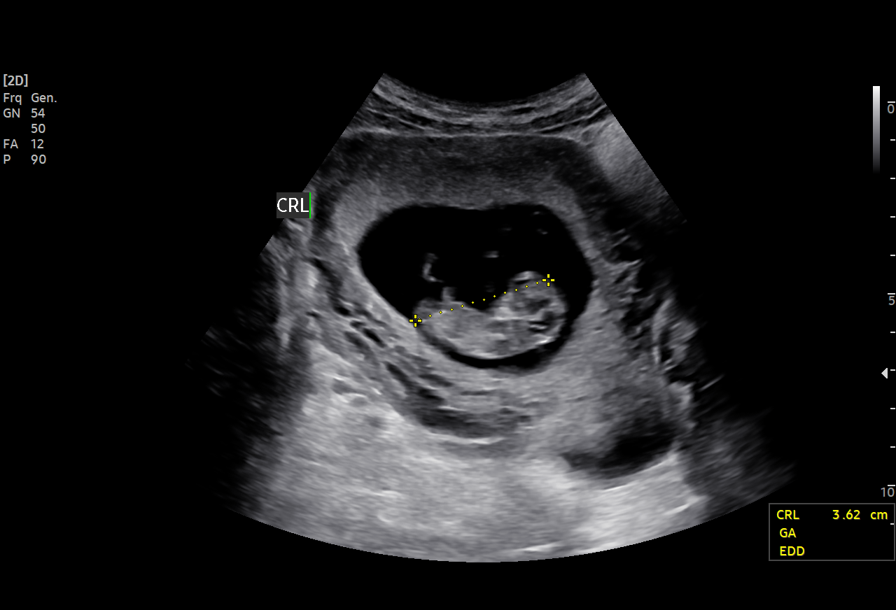
[im 3/10]
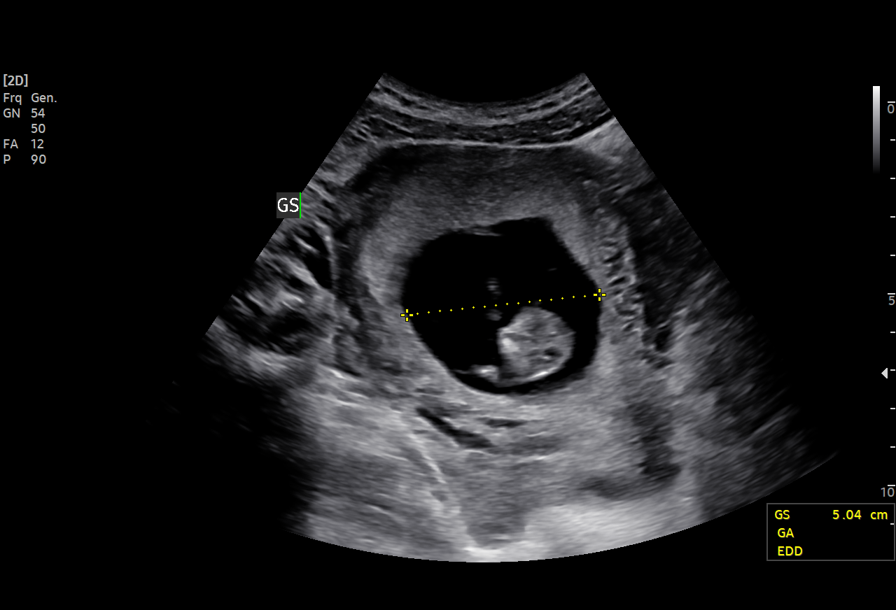
[im 4/10]
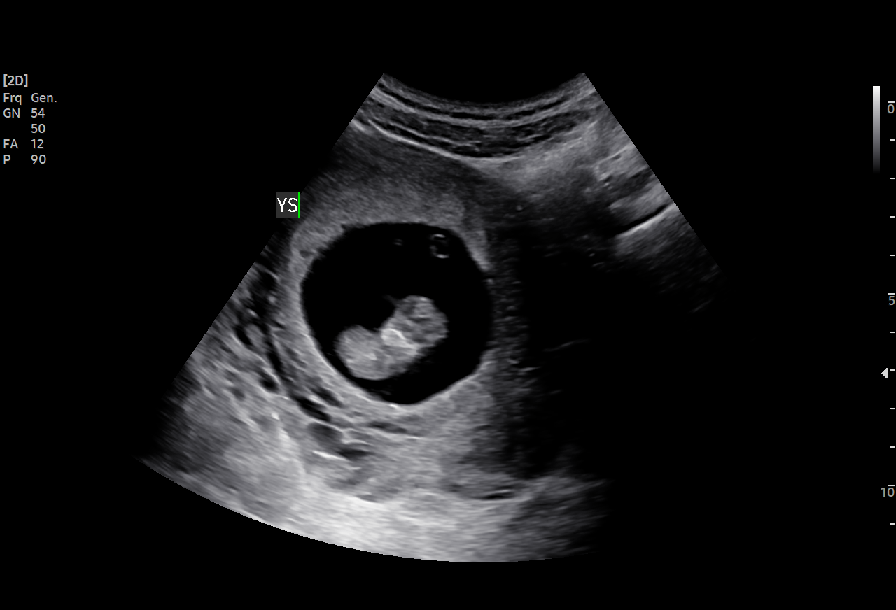
[im 5/10]
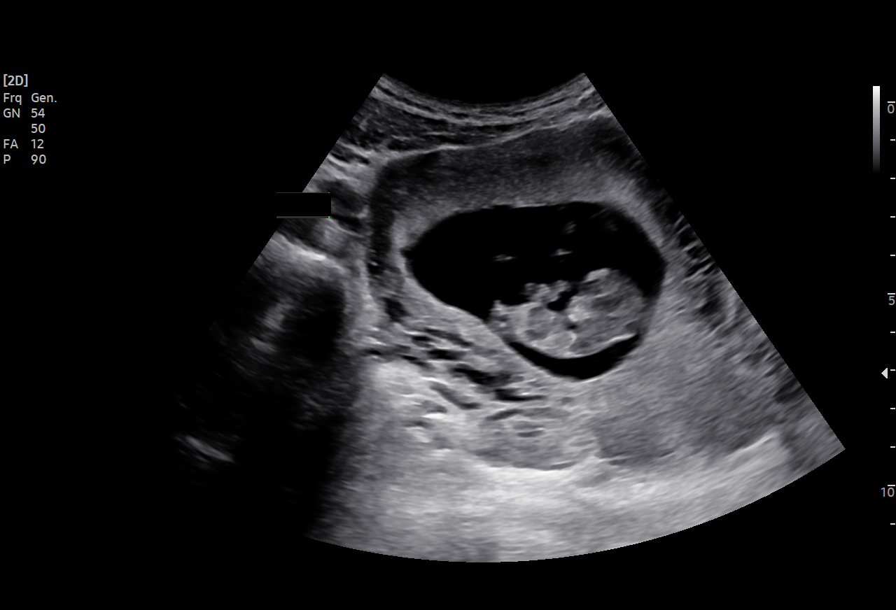
[im 6/10]
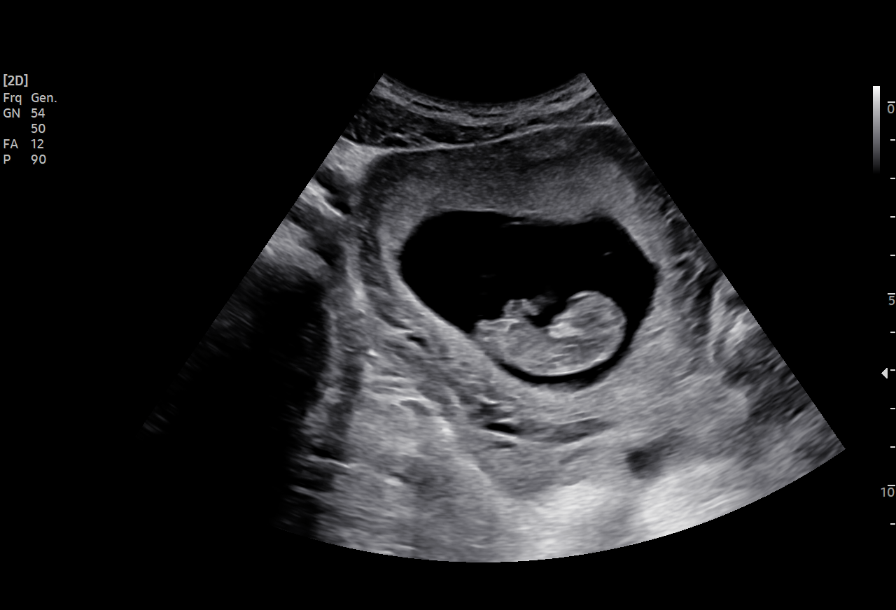
[im 7/10]
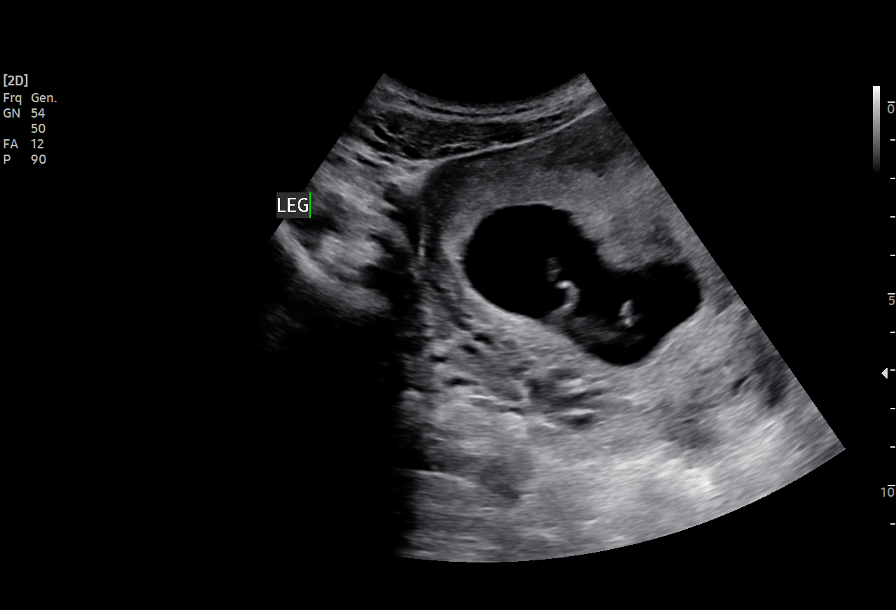
[im 8/10]
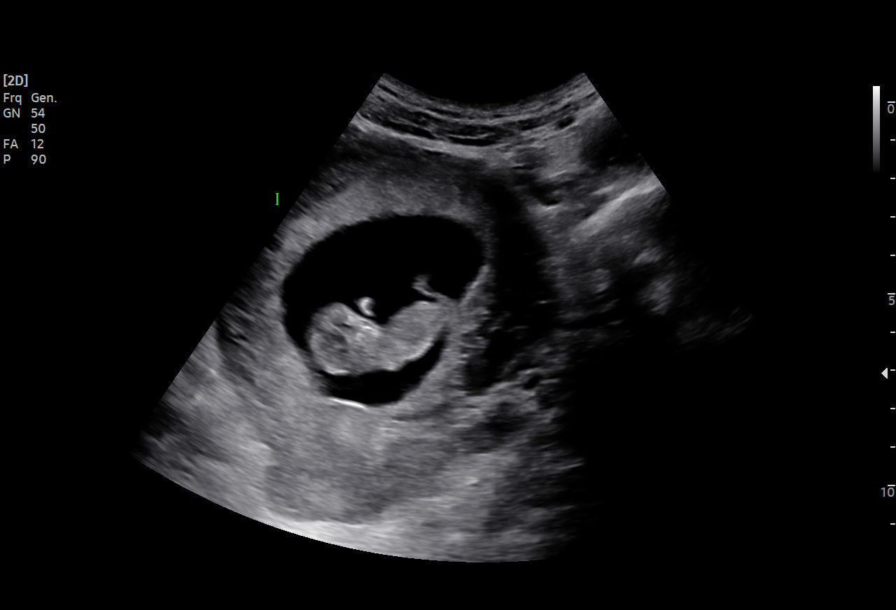
[im 9/10]
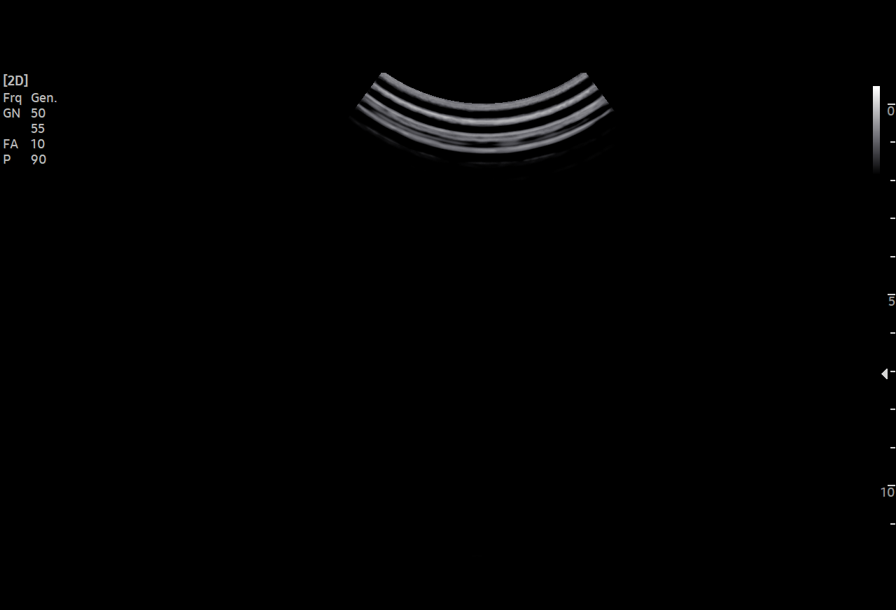
[im 10/10]
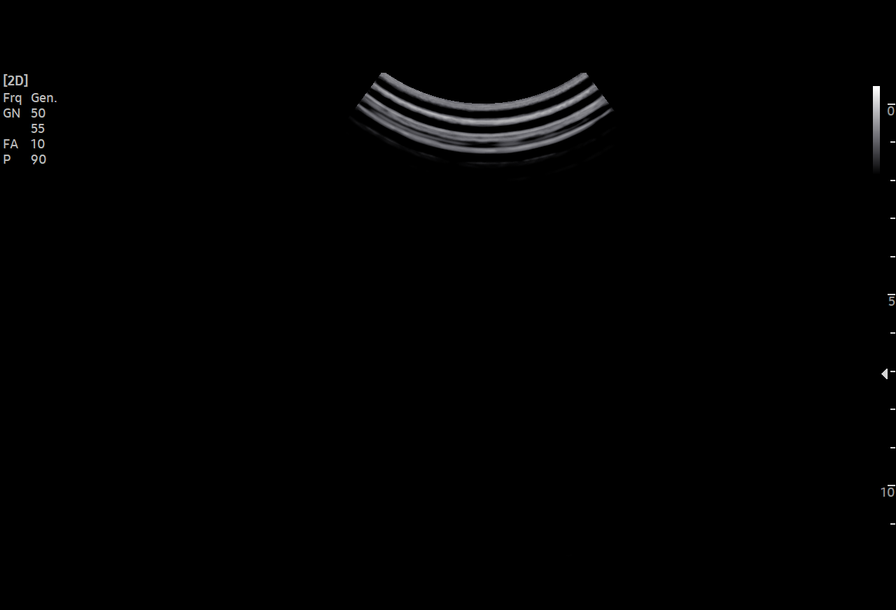

[10 of 10 positions shown; findings below may reference images not displayed]

[REDACTED]care

 1  [HOSPITAL]                         [address].0     NEGRO FABIOLA JONAS JONAS

Indications

 10 weeks gestation of pregnancy
 Dating and viability
Fetal Evaluation

 Num Of Fetuses:         1
 Fetal Heart Rate(bpm):  166
 Cardiac Activity:       Observed
Biometry

 GS:       50.4  mm     G. Age:  11w 4d                  EDD:   01/12/21
 CRL:      36.2  mm     G. Age:  10w 2d                  EDD:   01/21/21
Gestational Age

 Best:          10w 2d     Det. By:  U/S C R L (06/27/20)     EDD:   01/21/21
Comments

 Single live IUP at 54w1d. FHR 166
Impression

 [place] live IUP. EDD by US today.
Recommendations

 Start prenatal care.

## 2022-06-03 ENCOUNTER — Ambulatory Visit (HOSPITAL_COMMUNITY): Payer: Medicaid Other

## 2022-09-14 IMAGING — US US MFM OB DETAIL+14 WK
1 series · 13 of 28 positions shown · non-contrast
Comparison: none

[Series 1: us mfm ob detail+14 wk · 13 of 72 slices shown]
[im 3/72]
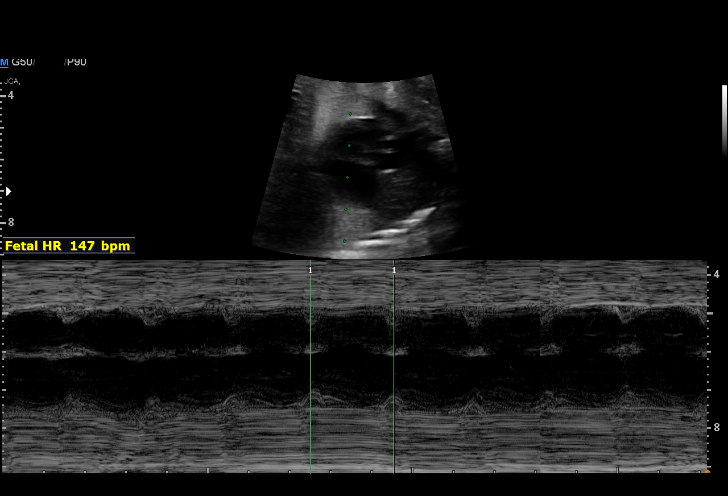
[im 8/72]
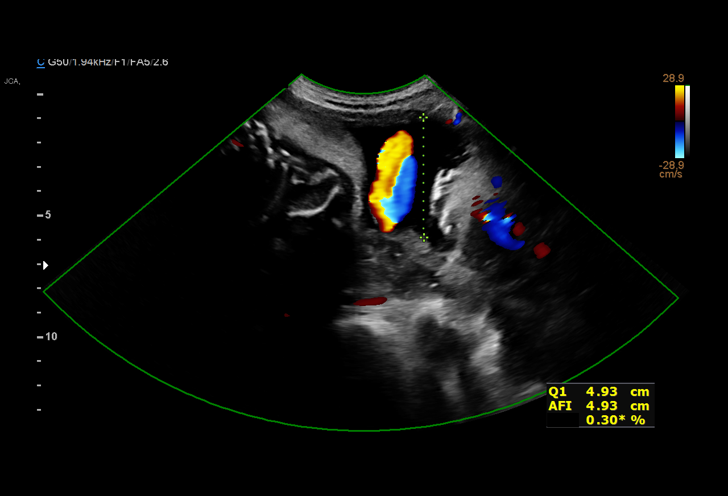
[im 14/72]
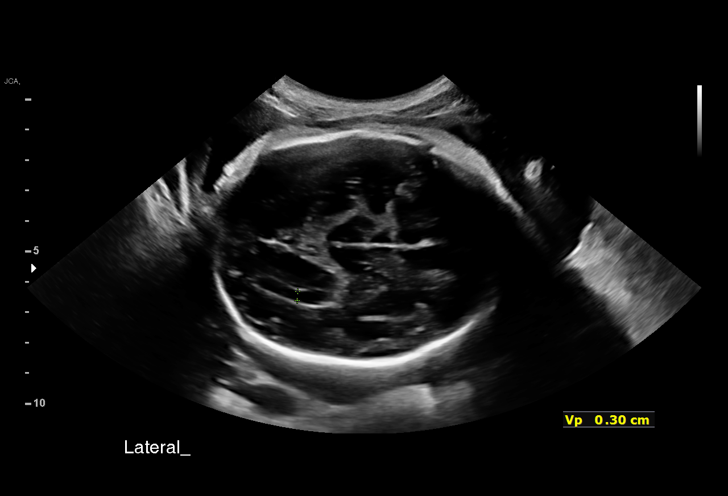
[im 19/72]
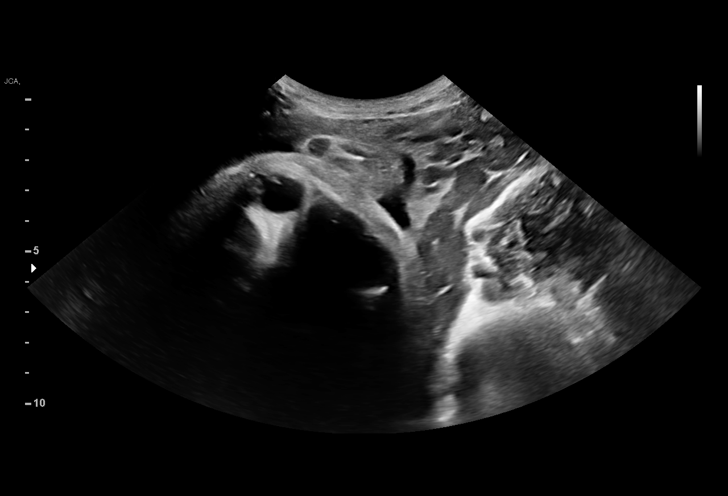
[im 24/72]
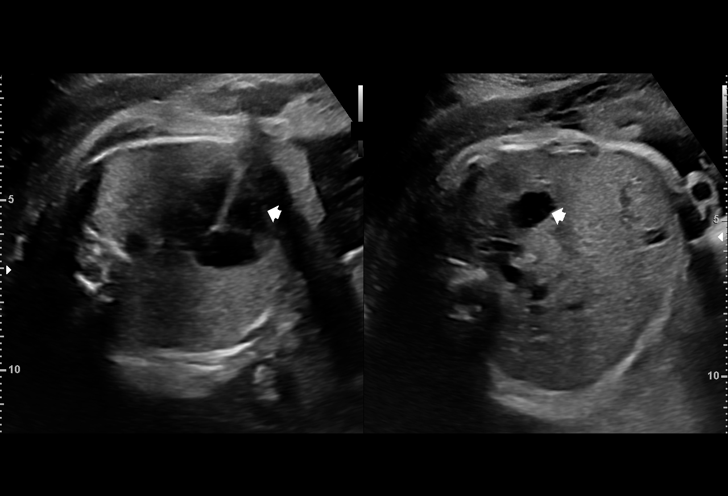
[im 29/72]
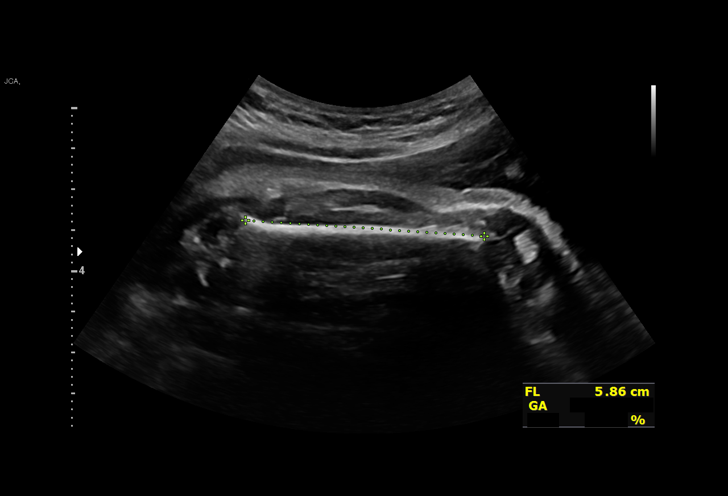
[im 37/72]
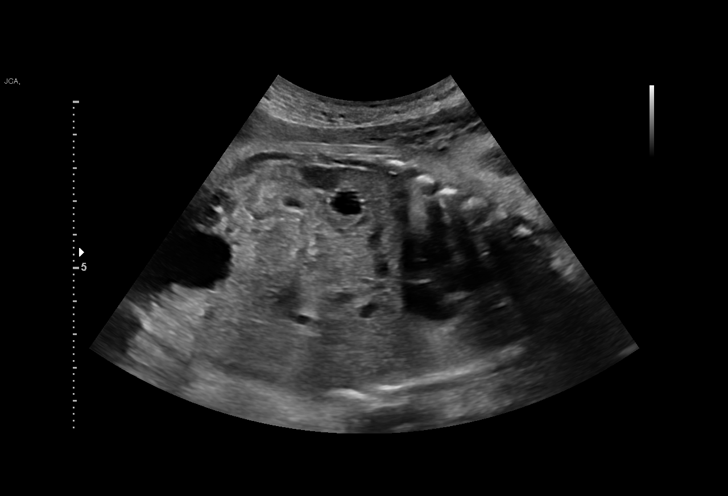
[im 43/72]
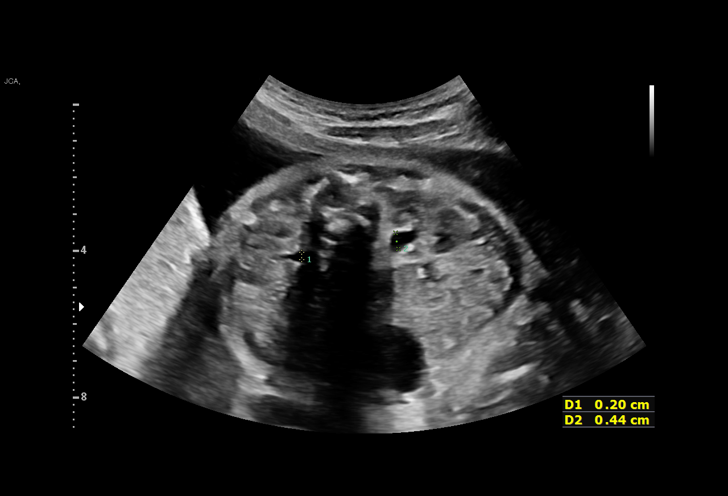
[im 48/72]
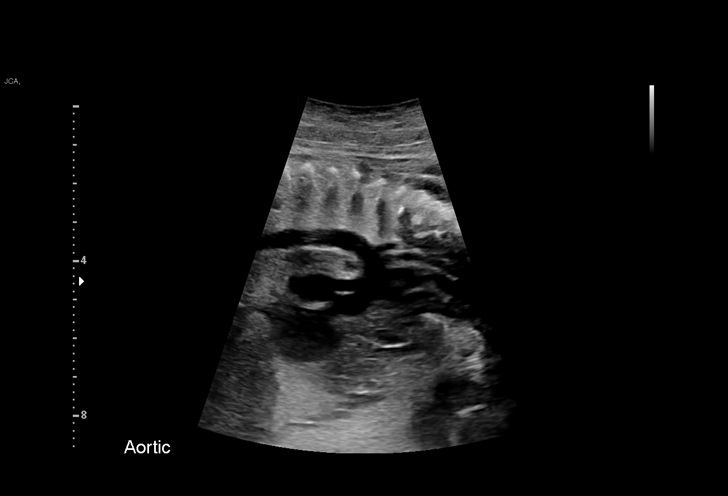
[im 53/72]
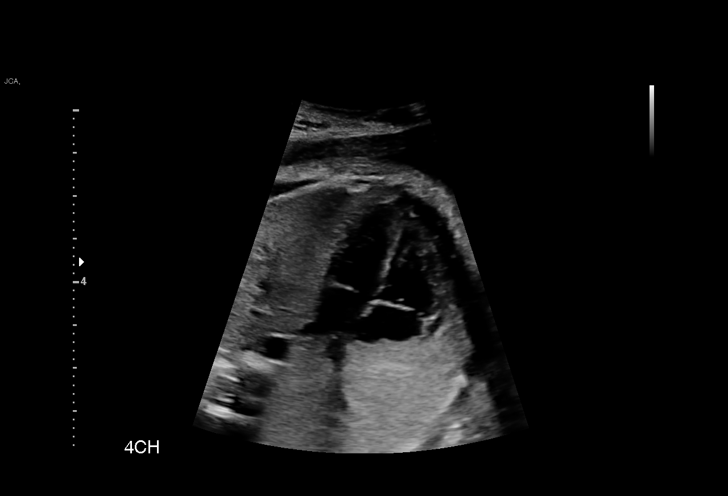
[im 58/72]
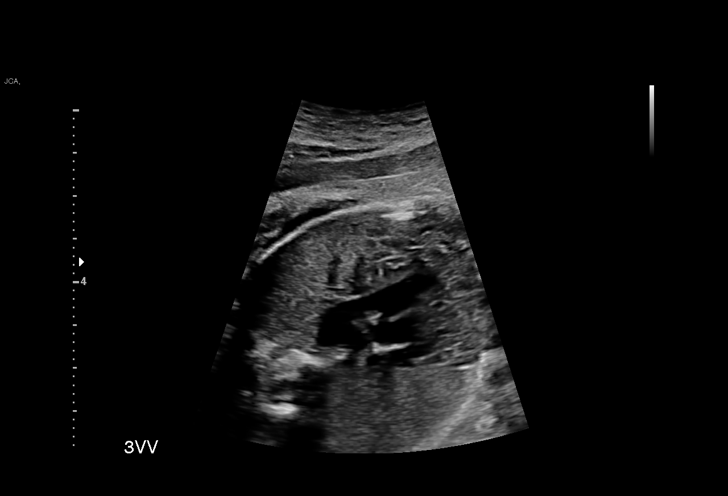
[im 64/72]
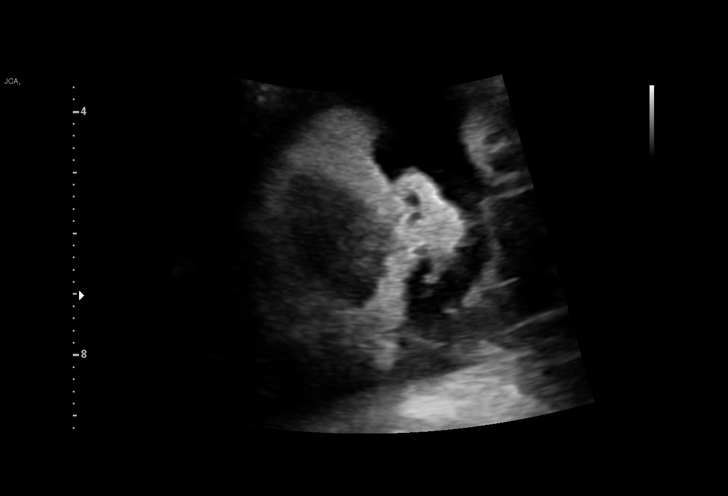
[im 69/72]
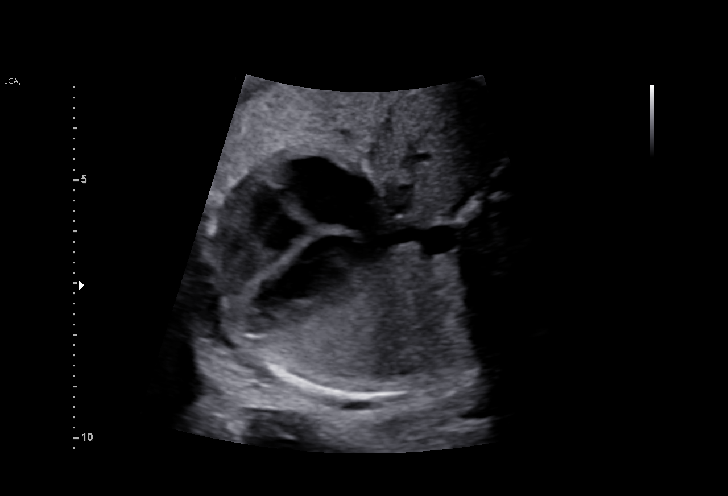

[13 of 28 positions shown; findings below may reference images not displayed]

Indications

 Rh negative state in antepartum
 Genetic carrier (Silent Reland Ypi)
 31 weeks gestation of pregnancy
 Encounter for antenatal screening for
 malformations (Low Risk NIPS, Neg AFP)
Fetal Evaluation

 Num Of Fetuses:         1
 Fetal Heart Rate(bpm):  147
 Cardiac Activity:       Observed
 Presentation:           Cephalic
 Placenta:               Posterior
 P. Cord Insertion:      Visualized

 Amniotic Fluid
 AFI FV:      Within normal limits

 AFI Sum(cm)     %Tile       Largest Pocket(cm)
 14.62           51

 RUQ(cm)       RLQ(cm)       LUQ(cm)        LLQ(cm)

Biometry

 BPD:      77.6  mm     G. Age:  31w 1d         39  %    CI:        73.81   %    70 - 86
                                                         FL/HC:      20.7   %    19.3 -
 HC:      286.9  mm     G. Age:  31w 4d         24  %    HC/AC:      1.00        0.96 -
 AC:      287.1  mm     G. Age:  32w 5d         88  %    FL/BPD:     76.4   %    71 - 87
 FL:       59.3  mm     G. Age:  30w 6d         30  %    FL/AC:      20.7   %    20 - 24
 HUM:      52.6  mm     G. Age:  30w 5d         42  %
 CER:      40.2  mm     G. Age:  32w 3d         65  %

 LV:          3  mm
 CM:        7.1  mm

 Est. FW:    7364  gm      4 lb 2 oz     65  %
Gestational Age

 U/S Today:     31w 4d                                        EDD:   01/18/21
 Best:          31w 1d     Det. By:  U/S C R L  (06/27/20)    EDD:   01/21/21
Anatomy

 Cranium:               Appears normal         Aortic Arch:            Appears normal
 Cavum:                 Appears normal         Ductal Arch:            Not well visualized
 Ventricles:            Appears normal         Diaphragm:              Appears normal
 Choroid Plexus:        Not well visualized    Stomach:                Appears normal, left
                                                                       sided
 Cerebellum:            Appears normal         Abdomen:                Appears normal
 Posterior Fossa:       Appears normal         Abdominal Wall:         Not well visualized
 Nuchal Fold:           Not applicable (>20    Cord Vessels:           Appears normal (3
                        wks GA)                                        vessel cord)
 Face:                  Orbits nl; profile not Kidneys:                Appear normal
                        well visualized
 Lips:                  Appears normal         Bladder:                Appears normal
 Thoracic:              Appears normal         Spine:                  Limited views
                        Appears normal                                 appear normal
 Heart:                 Appears normal         Upper Extremities:      Appears normal
                        (4CH, axis, and
                        situs)
 RVOT:                  Appears normal         Lower Extremities:      Appears normal
 LVOT:                  Not well visualized

 Other:  Technically difficult due to advanced gestational age. Technically
         difficult due to fetal position.
Comments

 This patient was seen for a detailed fetal anatomy scan. The
 patient has missed her prior appointments in our office.
 She denies any significant past medical history and denies
 any problems in her current pregnancy.
 She had a cell free DNA test earlier in her pregnancy which
 indicated a low risk for trisomy 21, 18, and 13. A male fetus is
 predicted.
 She was informed that the fetal growth and amniotic fluid
 level were appropriate for her gestational age.
 There were no obvious fetal anomalies noted on today's
 ultrasound exam. However, today's exam was limited due to
 her advanced gestational age.
 The patient was informed that anomalies may be missed due
 to technical limitations. If the fetus is in a suboptimal position
 or maternal habitus is increased, visualization of the fetus in
 the maternal uterus may be impaired.
 A follow up exam was scheduled in 4 weeks to assess the
 fetal growth and to try to complete the views of the fetal
 anatomy.

## 2022-09-21 ENCOUNTER — Ambulatory Visit (INDEPENDENT_AMBULATORY_CARE_PROVIDER_SITE_OTHER): Payer: Medicaid Other | Admitting: General Practice

## 2022-09-21 ENCOUNTER — Other Ambulatory Visit (HOSPITAL_COMMUNITY)
Admission: RE | Admit: 2022-09-21 | Discharge: 2022-09-21 | Disposition: A | Discharge status: Home / Self Care | Payer: Medicaid Other | Source: Ambulatory Visit | Attending: Family Medicine | Admitting: Family Medicine

## 2022-09-21 VITALS — BP 111/69 | HR 88 | Ht 63.0 in | Wt 127.6 lb

## 2022-09-21 DIAGNOSIS — Z113 Encounter for screening for infections with a predominantly sexual mode of transmission: Secondary | ICD-10-CM | POA: Insufficient documentation

## 2022-09-21 NOTE — Progress Notes (Signed)
SUBJECTIVE:  21 y.o. female complains of malodorous vaginal discharge for 2 week(s). Denies abnormal vaginal bleeding or significant pelvic pain or fever. No UTI symptoms. Denies history of known exposure to STD.  No LMP recorded.  OBJECTIVE:  She appears well, afebrile. Urine dipstick: not done.  ASSESSMENT:  Vaginal Discharge  Vaginal Odor   PLAN:  GC, chlamydia, trichomonas, BVAG, CVAG probe sent to lab. Treatment: To be determined once lab results are received ROV prn if symptoms persist or worsen.

## 2022-09-22 LAB — CERVICOVAGINAL ANCILLARY ONLY
Bacterial Vaginitis (gardnerella): NEGATIVE
Candida Glabrata: NEGATIVE
Candida Vaginitis: NEGATIVE
Chlamydia: NEGATIVE
Comment: NEGATIVE
Comment: NEGATIVE
Comment: NEGATIVE
Comment: NEGATIVE
Comment: NEGATIVE
Comment: NORMAL
Neisseria Gonorrhea: NEGATIVE
Trichomonas: NEGATIVE

## 2022-09-22 LAB — HEPATITIS C ANTIBODY: Hep C Virus Ab: NONREACTIVE

## 2022-09-22 LAB — HEPATITIS B SURFACE ANTIGEN: Hepatitis B Surface Ag: NEGATIVE

## 2022-09-22 LAB — HIV ANTIBODY (ROUTINE TESTING W REFLEX): HIV Screen 4th Generation wRfx: NONREACTIVE

## 2022-09-22 LAB — RPR: RPR Ser Ql: NONREACTIVE

## 2024-05-03 ENCOUNTER — Ambulatory Visit (HOSPITAL_COMMUNITY): Payer: Self-pay

## 2024-05-07 ENCOUNTER — Ambulatory Visit (HOSPITAL_COMMUNITY)
Admission: RE | Admit: 2024-05-07 | Discharge: 2024-05-07 | Disposition: A | Discharge status: Home / Self Care | Source: Ambulatory Visit | Attending: Emergency Medicine | Admitting: Emergency Medicine

## 2024-05-07 ENCOUNTER — Encounter (HOSPITAL_COMMUNITY): Payer: Self-pay

## 2024-05-07 VITALS — BP 114/61 | HR 92 | Temp 98.4°F | Resp 14

## 2024-05-07 DIAGNOSIS — Z113 Encounter for screening for infections with a predominantly sexual mode of transmission: Secondary | ICD-10-CM | POA: Diagnosis present

## 2024-05-07 NOTE — Discharge Instructions (Addendum)
 Today you have been screened for sexually transmitted infections.  Results will be back over the next few days and our staff will contact you if anything is abnormal.  Abstain from intercourse until results have been received.

## 2024-05-07 NOTE — ED Provider Notes (Signed)
 [facility]    CSN: [id number] Arrival date & time: 05/07/24  1002      History   Chief Complaint No chief complaint on file.   HPI Cynthia Beasley is a 23 y.o. female.   Patient presents to clinic requesting screening for sexually transmitted infections.  She had an encounter with a new partner where they initially used a condom and then he took it off during intercourse.  This encounter occurred around 2 weeks ago.  Has not had any vaginal discharge, vaginal sores, lesions, pelvic pain, abdominal pain or urinary symptoms.   Would like HIV and syphilis screening.  The history is provided by the patient and medical records.    Past Medical History:  Diagnosis Date   Medical history non-contributory     Patient Active Problem List   Diagnosis Date Noted   Normal labor 01/25/2021   Shoulder dystocia during labor and delivery 01/25/2021   Vaginal delivery 01/25/2021   COVID-19 affecting pregnancy in third trimester 01/25/2021   Rh negative state in antepartum period 11/11/2020   Alpha thalassemia silent carrier 08/16/2020   Encounter for supervision of normal pregnancy, unspecified, unspecified trimester 06/27/2020    Past Surgical History:  Procedure Laterality Date   HAND SURGERY Left    HAND SURGERY Left 2019    OB History     Gravida  1   Para  1   Term  1   Preterm      AB      Living  1      SAB      IAB      Ectopic      Multiple  0   Live Births  1            Home Medications    Prior to Admission medications   Medication Sig Start Date End Date Taking? Authorizing Provider  acetaminophen  (TYLENOL ) 500 MG tablet Take 2 tablets (1,000 mg total) by mouth every 6 (six) hours as needed (for pain scale < 4). Patient not taking: Reported on 09/21/2022 01/27/21   Firestone, Alicia C, MD  Blood Pressure Monitor KIT 1 kit by Does not apply route once a week. Patient not taking: No sig reported 06/27/20   Jan Mcgill, MD  Blood  Pressure Monitoring (BLOOD PRESSURE KIT) DEVI 1 kit by Does not apply route as needed. Large cuff Patient not taking: No sig reported 09/01/20   Chestine Costain, NP  coconut oil OIL Apply 1 application topically as needed. Patient not taking: Reported on 09/21/2022 01/27/21   Firestone, Alicia C, MD  ibuprofen  (ADVIL ) 600 MG tablet Take 1 tablet (600 mg total) by mouth every 6 (six) hours as needed. Patient not taking: Reported on 09/21/2022 01/27/21   Firestone, Alicia C, MD  norethindrone  (ORTHO MICRONOR ) 0.35 MG tablet Take 1 tablet (0.35 mg total) by mouth daily. 01/27/21 01/27/22  Firestone, Alicia C, MD  Prenatal Vit-Fe Fumarate-FA (PRENATAL MULTIVITAMIN) TABS tablet Take 1 tablet by mouth daily at 12 noon.    [provider]    Family History Family History  Problem Relation Age of Onset   Healthy Mother    Healthy Father     Social History Social History   Tobacco Use   Smoking status: Passive Smoke Exposure - Never Smoker   Smokeless tobacco: Never  Vaping Use   Vaping status: Never Used  Substance Use Topics   Alcohol use: Never   Drug use: Not Currently  Types: Marijuana     Allergies   Patient has no known allergies.   Review of Systems Review of Systems  Per HPI  Physical Exam Triage Vital Signs ED Triage Vitals [05/07/24 1039]  Encounter Vitals Group     BP 114/61     Systolic BP Percentile      Diastolic BP Percentile      Pulse Rate 92     Resp 14     Temp 98.4 F (36.9 C)     Temp Source Oral     SpO2 98 %     Weight      Height      Head Circumference      Peak Flow      Pain Score 0     Pain Loc      Pain Education      Exclude from Growth Chart    No data found.  Updated Vital Signs BP 114/61 (BP Location: Left Arm)   Pulse 92   Temp 98.4 F (36.9 C) (Oral)   Resp 14   LMP 04/29/2024   SpO2 98%   Visual Acuity Right Eye Distance:   Left Eye Distance:   Bilateral Distance:    Right Eye Near:   Left Eye Near:     Bilateral Near:     Physical Exam Vitals and nursing note reviewed.  Constitutional:      Appearance: Normal appearance.  HENT:     Head: Normocephalic and atraumatic.     Right Ear: External ear normal.     Left Ear: External ear normal.     Nose: Nose normal.     Mouth/Throat:     Mouth: Mucous membranes are moist.  Eyes:     Conjunctiva/sclera: Conjunctivae normal.  Cardiovascular:     Rate and Rhythm: Normal rate.  Pulmonary:     Effort: Pulmonary effort is normal. No respiratory distress.  Skin:    General: Skin is warm and dry.  Neurological:     General: No focal deficit present.     Mental Status: She is alert.  Psychiatric:        Mood and Affect: Mood normal.      UC Treatments / Results  Labs (all labs ordered are listed, but only abnormal results are displayed) Labs Reviewed  RPR  HIV ANTIBODY (ROUTINE TESTING W REFLEX)  CERVICOVAGINAL ANCILLARY ONLY    EKG   Radiology No results found.  Procedures Procedures (including critical care time)  Medications Ordered in UC Medications - No data to display  Initial Impression / Assessment and Plan / UC Course  I have reviewed the triage vital signs and the nursing notes.  Pertinent labs & imaging results that were available during my care of the patient were reviewed by me and considered in my medical decision making (see chart for details).  Vitals and triage reviewed, patient is hemodynamically stable.  Asymptomatic STI screening.  Without urinary symptoms.  Recent menstrual cycle.  Sexually-transmitted infection screening obtained.  Staff will contact if treatment is needed.     Final Clinical Impressions(s) / UC Diagnoses   Final diagnoses:  Screening examination for sexually transmitted disease     Discharge Instructions      Today you have been screened for sexually transmitted infections.  Results will be back over the next few days and our staff will contact you if anything is  abnormal.  Abstain from intercourse until results have been received.  ED Prescriptions  None    PDMP not reviewed this encounter.   Harlow Lighter, Apostolos Blagg  N, FNP 05/07/24 1101

## 2024-05-07 NOTE — ED Triage Notes (Signed)
 Patient states she has a new sexual partner and is wanting STD testing.  Patient denies any symptoms.

## 2024-05-08 ENCOUNTER — Ambulatory Visit (HOSPITAL_COMMUNITY): Payer: Self-pay

## 2024-05-08 LAB — CERVICOVAGINAL ANCILLARY ONLY
Chlamydia: POSITIVE — AB
Comment: NEGATIVE
Comment: NEGATIVE
Comment: NORMAL
Neisseria Gonorrhea: NEGATIVE
Trichomonas: NEGATIVE

## 2024-05-08 LAB — RPR: RPR Ser Ql: NONREACTIVE

## 2024-05-08 MED ORDER — DOXYCYCLINE HYCLATE 100 MG PO TABS: 100.0000 mg | ORAL_TABLET | Freq: Two times a day (BID) | ORAL | 0 refills | Status: AC

## 2024-05-09 LAB — MISC LABCORP TEST (SEND OUT): Labcorp test code: 83935

## 2024-05-23 ENCOUNTER — Ambulatory Visit: Admitting: Obstetrics and Gynecology

## 2024-05-25 ENCOUNTER — Ambulatory Visit

## 2024-05-31 ENCOUNTER — Ambulatory Visit
Admission: RE | Admit: 2024-05-31 | Discharge: 2024-05-31 | Disposition: A | Discharge status: Home / Self Care | Source: Ambulatory Visit | Attending: Family Medicine | Admitting: Family Medicine

## 2024-05-31 VITALS — BP 117/66 | HR 100 | Temp 98.8°F | Resp 18

## 2024-05-31 DIAGNOSIS — N760 Acute vaginitis: Secondary | ICD-10-CM | POA: Diagnosis present

## 2024-05-31 NOTE — ED Triage Notes (Signed)
 Patient is here for STD-testing. Reports some vaginal odor. Patient reports partner did not get treated for his symptoms.

## 2024-05-31 NOTE — ED Provider Notes (Signed)
 [facility]  Note:  This document was prepared using Conservation officer, historic buildings and may include unintentional dictation errors.  MRN: [id number] DOB: August 05, 2001  Subjective:   Cynthia Beasley is a 23 y.o. female presenting for repeat vaginal cytology.  Patient just came off of her cycle and feels that there is a different odor.  She was treated for chlamydia about 3 weeks ago and completed all of her treatment.  She did have sex again with the same partner and unfortunately does not know if he got treated.  She reports that she has ended the relationship and has no intent on having sex with this person again.  Would just like to make sure that she is clear of her infection.  Denies fever, n/v, abdominal pain, pelvic pain, rashes, dysuria, urinary frequency, hematuria, vaginal discharge.    No current facility-administered medications for this encounter.  Current Outpatient Medications:    acetaminophen  (TYLENOL ) 500 MG tablet, Take 2 tablets (1,000 mg total) by mouth every 6 (six) hours as needed (for pain scale < 4). (Patient not taking: Reported on 09/21/2022), Disp: , Rfl:    Blood Pressure Monitor KIT, 1 kit by Does not apply route once a week. (Patient not taking: No sig reported), Disp: 1 kit, Rfl: 0   Blood Pressure Monitoring (BLOOD PRESSURE KIT) DEVI, 1 kit by Does not apply route as needed. Large cuff (Patient not taking: No sig reported), Disp: 1 each, Rfl: 0   coconut oil OIL, Apply 1 application topically as needed. (Patient not taking: Reported on 09/21/2022), Disp: , Rfl: 0   ibuprofen  (ADVIL ) 600 MG tablet, Take 1 tablet (600 mg total) by mouth every 6 (six) hours as needed. (Patient not taking: Reported on 09/21/2022), Disp: 30 tablet, Rfl: 0   norethindrone  (ORTHO MICRONOR ) 0.35 MG tablet, Take 1 tablet (0.35 mg total) by mouth daily., Disp: 28 tablet, Rfl: 11   Prenatal Vit-Fe Fumarate-FA (PRENATAL MULTIVITAMIN) TABS tablet, Take 1 tablet by mouth  daily at 12 noon., Disp: , Rfl:    No Known Allergies  Past Medical History:  Diagnosis Date   Medical history non-contributory      Past Surgical History:  Procedure Laterality Date   HAND SURGERY Left    HAND SURGERY Left 2019    Family History  Problem Relation Age of Onset   Healthy Mother    Healthy Father     Social History   Tobacco Use   Smoking status: Passive Smoke Exposure - Never Smoker   Smokeless tobacco: Never  Vaping Use   Vaping status: Never Used  Substance Use Topics   Alcohol use: Never   Drug use: Not Currently    Types: Marijuana    ROS   Objective:   Vitals: BP 117/66 (BP Location: Left Arm)   Pulse 100   Temp 98.8 F (37.1 C) (Oral)   Resp 18   LMP 05/25/2024 (Approximate)   SpO2 96%   Physical Exam Constitutional:      General: She is not in acute distress.    Appearance: Normal appearance. She is well-developed. She is not ill-appearing, toxic-appearing or diaphoretic.  HENT:     Head: Normocephalic and atraumatic.     Nose: Nose normal.     Mouth/Throat:     Mouth: Mucous membranes are moist.   Eyes:     General: No scleral icterus.       Right eye: No discharge.  Left eye: No discharge.     Extraocular Movements: Extraocular movements intact.    Cardiovascular:     Rate and Rhythm: Normal rate.  Pulmonary:     Effort: Pulmonary effort is normal.   Skin:    General: Skin is warm and dry.   Neurological:     General: No focal deficit present.     Mental Status: She is alert and oriented to person, place, and time.   Psychiatric:        Mood and Affect: Mood normal.        Behavior: Behavior normal.       Assessment and Plan :   PDMP not reviewed this encounter.  1. Acute vaginitis    Will treat as appropriate based on prior lab results.   Adolph Hoop, PA-C 05/31/24 1239

## 2024-06-01 ENCOUNTER — Ambulatory Visit (HOSPITAL_COMMUNITY): Payer: Self-pay

## 2024-06-01 LAB — CERVICOVAGINAL ANCILLARY ONLY
Bacterial Vaginitis (gardnerella): POSITIVE — AB
Candida Glabrata: NEGATIVE
Candida Vaginitis: NEGATIVE
Chlamydia: NEGATIVE
Comment: NEGATIVE
Comment: NEGATIVE
Comment: NEGATIVE
Comment: NEGATIVE
Comment: NEGATIVE
Comment: NORMAL
Neisseria Gonorrhea: NEGATIVE
Trichomonas: NEGATIVE

## 2024-06-18 ENCOUNTER — Ambulatory Visit (HOSPITAL_COMMUNITY)

## 2024-07-05 ENCOUNTER — Ambulatory Visit (HOSPITAL_COMMUNITY)
Admission: RE | Admit: 2024-07-05 | Discharge: 2024-07-05 | Disposition: A | Discharge status: Home / Self Care | Source: Ambulatory Visit | Attending: Family Medicine | Admitting: Family Medicine

## 2024-07-05 ENCOUNTER — Encounter (HOSPITAL_COMMUNITY): Payer: Self-pay

## 2024-07-05 VITALS — BP 104/69 | HR 98 | Temp 98.8°F | Resp 18 | Ht 63.0 in | Wt 120.0 lb

## 2024-07-05 DIAGNOSIS — N898 Other specified noninflammatory disorders of vagina: Secondary | ICD-10-CM | POA: Diagnosis not present

## 2024-07-05 DIAGNOSIS — J029 Acute pharyngitis, unspecified: Secondary | ICD-10-CM | POA: Diagnosis present

## 2024-07-05 DIAGNOSIS — N3090 Cystitis, unspecified without hematuria: Secondary | ICD-10-CM | POA: Diagnosis present

## 2024-07-05 LAB — POCT URINALYSIS DIP (MANUAL ENTRY)
Glucose, UA: NEGATIVE mg/dL
Nitrite, UA: NEGATIVE
Protein Ur, POC: 100 mg/dL — AB
Spec Grav, UA: 1.025 (ref 1.010–1.025)
Urobilinogen, UA: 0.2 U/dL
pH, UA: 6 (ref 5.0–8.0)

## 2024-07-05 LAB — POCT RAPID STREP A (OFFICE): Rapid Strep A Screen: NEGATIVE

## 2024-07-05 MED ORDER — CEPHALEXIN 500 MG PO CAPS: 500.0000 mg | ORAL_CAPSULE | Freq: Two times a day (BID) | ORAL | 0 refills | Status: DC

## 2024-07-05 NOTE — Discharge Instructions (Addendum)
 In addition to a urine culture and throat culture, we have sent testing for various causes of vaginal infections. We will notify you of any positive results once they are received. If required, we will prescribe any medications you might need.  Please refrain from all sexual activity for at least the next seven days.

## 2024-07-05 NOTE — ED Triage Notes (Addendum)
 Patient presenting with burning urination onset 4 days ago. Patient also having a sore throat onset 2 days ago. Denies any other sick symptoms. No known sick exposure.  Prescriptions or OTC medications tried: No    Patient states she thinks she has BV and requesting treatment for such.

## 2024-07-05 NOTE — ED Provider Notes (Signed)
 [facility]   [id number] 07/05/24 Arrival Time: 1010  ASSESSMENT & PLAN:  1. Cystitis   2. Vaginal discharge   3. Sore throat    No signs of peritonsillar abscess. Rapid strep negative. Throat culture sent. Begin: Meds ordered this encounter  Medications   cephALEXin  (KEFLEX ) 500 MG capsule    Sig: Take 1 capsule (500 mg total) by mouth 2 (two) times daily.    Dispense:  10 capsule    Refill:  0  Urine culture sent. Vaginal cytology sent.  Results for orders placed or performed during the hospital encounter of 07/05/24  POC urinalysis dipstick   Collection Time: 07/05/24 10:58 AM  Result Value Ref Range   Color, UA straw (A) yellow   Clarity, UA cloudy (A) clear   Glucose, UA negative negative mg/dL   Bilirubin, UA small (A) negative   Ketones, POC UA moderate (40) (A) negative mg/dL   Spec Grav, UA 8.974 8.989 - 1.025   Blood, UA moderate (A) negative   pH, UA 6.0 5.0 - 8.0   Protein Ur, POC =100 (A) negative mg/dL   Urobilinogen, UA 0.2 0.2 or 1.0 E.U./dL   Nitrite, UA Negative Negative   Leukocytes, UA Small (1+) (A) Negative  POC rapid strep A   Collection Time: 07/05/24 11:36 AM  Result Value Ref Range   Rapid Strep A Screen Negative Negative      Discharge Instructions      In addition to a urine culture and throat culture, we have sent testing for various causes of vaginal infections. We will notify you of any positive results once they are received. If required, we will prescribe any medications you might need.  Please refrain from all sexual activity for at least the next seven days.    Reviewed expectations re: course of current medical issues. Questions answered. Outlined signs and symptoms indicating need for more acute intervention. Patient verbalized understanding. After Visit Summary given.   SUBJECTIVE:  Cynthia Beasley is a 23 y.o. female who presents with reported burning with urination; onset 4 days ago. Patient also having a  sore throat; onset 2 days ago.  Patient also states she thinks she has BV and requesting treatment for such.  Denies fever/abd pain. No tx PTA.  OBJECTIVE:  Vitals:   07/05/24 1045  BP: 104/69  Pulse: 98  Resp: 18  Temp: 98.8 F (37.1 C)  TempSrc: Oral  SpO2: 98%  Weight: 54.4 kg  Height: 5' 3 (1.6 m)    General appearance: alert; no distress HEENT: throat with moderate erythema and with bilat tonsillar hypertrophy; uvula is midline Neck: supple with FROM; no lymphadenopathy Lungs: speaks full sentences without difficulty; unlabored Abd: soft; non-tender Skin: reveals no rash; warm and dry Psychological: alert and cooperative; normal mood and affect  No Known Allergies  Past Medical History:  Diagnosis Date   Medical history non-contributory    Social History   Socioeconomic History   Marital status: Single    Spouse name: Not on file   Number of children: Not on file   Years of education: Not on file   Highest education level: Not on file  Occupational History   Not on file  Tobacco Use   Smoking status: Passive Smoke Exposure - Never Smoker   Smokeless tobacco: Never  Vaping Use   Vaping status: Never Used  Substance and Sexual Activity   Alcohol use: Never   Drug use: Not Currently    Types: Marijuana  Sexual activity: Yes    Partners: Male    Birth control/protection: Condom  Other Topics Concern   Not on file  Social History Narrative   Not on file   Social Drivers of Health   Financial Resource Strain: Not on file  Food Insecurity: Not on file  Transportation Needs: Not on file  Physical Activity: Not on file  Stress: Not on file  Social Connections: Not on file  Intimate Partner Violence: Not on file   Family History  Problem Relation Age of Onset   Healthy Mother    Healthy Father            Rolinda Rogue, MD 07/05/24 [phone]

## 2024-07-06 LAB — URINE CULTURE: Culture: 10000 — AB

## 2024-07-06 LAB — CERVICOVAGINAL ANCILLARY ONLY
Bacterial Vaginitis (gardnerella): NEGATIVE
Candida Glabrata: NEGATIVE
Candida Vaginitis: NEGATIVE
Chlamydia: NEGATIVE
Comment: NEGATIVE
Comment: NEGATIVE
Comment: NEGATIVE
Comment: NEGATIVE
Comment: NEGATIVE
Comment: NORMAL
Neisseria Gonorrhea: NEGATIVE
Trichomonas: NEGATIVE

## 2024-07-08 LAB — CULTURE, GROUP A STREP (THRC)

## 2024-07-09 ENCOUNTER — Emergency Department (HOSPITAL_COMMUNITY)
Admission: EM | Admit: 2024-07-09 | Discharge: 2024-07-09 | Disposition: A | Discharge status: Home / Self Care | Attending: Emergency Medicine | Admitting: Emergency Medicine

## 2024-07-09 ENCOUNTER — Ambulatory Visit (HOSPITAL_COMMUNITY): Payer: Self-pay

## 2024-07-09 ENCOUNTER — Encounter (HOSPITAL_COMMUNITY): Payer: Self-pay

## 2024-07-09 ENCOUNTER — Emergency Department (HOSPITAL_COMMUNITY)

## 2024-07-09 DIAGNOSIS — R112 Nausea with vomiting, unspecified: Secondary | ICD-10-CM | POA: Diagnosis present

## 2024-07-09 DIAGNOSIS — D72819 Decreased white blood cell count, unspecified: Secondary | ICD-10-CM | POA: Diagnosis not present

## 2024-07-09 DIAGNOSIS — E860 Dehydration: Secondary | ICD-10-CM | POA: Insufficient documentation

## 2024-07-09 DIAGNOSIS — E872 Acidosis, unspecified: Secondary | ICD-10-CM | POA: Diagnosis not present

## 2024-07-09 LAB — URINALYSIS, ROUTINE W REFLEX MICROSCOPIC
Bilirubin Urine: NEGATIVE
Glucose, UA: NEGATIVE mg/dL
Ketones, ur: 80 mg/dL — AB
Leukocytes,Ua: NEGATIVE
Nitrite: NEGATIVE
Protein, ur: 100 mg/dL — AB
Specific Gravity, Urine: 1.028 (ref 1.005–1.030)
pH: 5 (ref 5.0–8.0)

## 2024-07-09 LAB — CBC
HCT: 40.5 % (ref 36.0–46.0)
Hemoglobin: 13.1 g/dL (ref 12.0–15.0)
MCH: 26.6 pg (ref 26.0–34.0)
MCHC: 32.3 g/dL (ref 30.0–36.0)
MCV: 82.3 fL (ref 80.0–100.0)
Platelets: 185 K/uL (ref 150–400)
RBC: 4.92 MIL/uL (ref 3.87–5.11)
RDW: 12.7 % (ref 11.5–15.5)
WBC: 3.9 K/uL — ABNORMAL LOW (ref 4.0–10.5)
nRBC: 0 % (ref 0.0–0.2)

## 2024-07-09 LAB — COMPREHENSIVE METABOLIC PANEL WITH GFR
ALT: 19 U/L (ref 0–44)
AST: 24 U/L (ref 15–41)
Albumin: 3.6 g/dL (ref 3.5–5.0)
Alkaline Phosphatase: 26 U/L — ABNORMAL LOW (ref 38–126)
Anion gap: 14 (ref 5–15)
BUN: 8 mg/dL (ref 6–20)
CO2: 18 mmol/L — ABNORMAL LOW (ref 22–32)
Calcium: 8.5 mg/dL — ABNORMAL LOW (ref 8.9–10.3)
Chloride: 102 mmol/L (ref 98–111)
Creatinine, Ser: 0.99 mg/dL (ref 0.44–1.00)
GFR, Estimated: >60 mL/min (ref >60)
Glucose, Bld: 102 mg/dL — ABNORMAL HIGH (ref 70–99)
Potassium: 3.5 mmol/L (ref 3.5–5.1)
Sodium: 134 mmol/L — ABNORMAL LOW (ref 135–145)
Total Bilirubin: 0.7 mg/dL (ref 0.0–1.2)
Total Protein: 7.3 g/dL (ref 6.5–8.1)

## 2024-07-09 LAB — RAPID HIV SCREEN (HIV 1/2 AB+AG)
HIV 1/2 Antibodies: NONREACTIVE
HIV-1 P24 Antigen - HIV24: NONREACTIVE

## 2024-07-09 LAB — I-STAT CG4 LACTIC ACID, ED: Lactic Acid, Venous: 0.6 mmol/L (ref 0.5–1.9)

## 2024-07-09 LAB — LIPASE, BLOOD: Lipase: 34 U/L (ref 11–51)

## 2024-07-09 LAB — RESP PANEL BY RT-PCR (RSV, FLU A&B, COVID)  RVPGX2
Influenza A by PCR: NEGATIVE
Influenza B by PCR: NEGATIVE
Resp Syncytial Virus by PCR: NEGATIVE
SARS Coronavirus 2 by RT PCR: NEGATIVE

## 2024-07-09 LAB — HCG, SERUM, QUALITATIVE: Preg, Serum: NEGATIVE

## 2024-07-09 MED ORDER — ONDANSETRON HCL 4 MG/2ML IJ SOLN: 4.0000 mg | Freq: Four times a day (QID) | INTRAMUSCULAR | Status: DC | PRN

## 2024-07-09 MED ORDER — ONDANSETRON 4 MG PO TBDP: 4.0000 mg | ORAL_TABLET | Freq: Three times a day (TID) | ORAL | 0 refills | Status: AC | PRN

## 2024-07-09 MED ORDER — ACETAMINOPHEN 325 MG PO TABS
650.0000 mg | ORAL_TABLET | Freq: Once | ORAL | Status: AC
  Administered 2024-07-09: 650 mg via ORAL
  Filled 2024-07-09: qty 2

## 2024-07-09 MED ORDER — IOHEXOL 350 MG/ML SOLN
50.0000 mL | Freq: Once | INTRAVENOUS | Status: AC | PRN
  Administered 2024-07-09: 50 mL via INTRAVENOUS

## 2024-07-09 MED ORDER — LACTATED RINGERS IV BOLUS (SEPSIS)
1000.0000 mL | Freq: Once | INTRAVENOUS | Status: AC
  Administered 2024-07-09: 1000 mL via INTRAVENOUS

## 2024-07-09 NOTE — Discharge Instructions (Addendum)
 Your test results today are reassuring.  Continue supportive care at home.  This includes drinking plenty of fluids to stay hydrated.  A prescription for medication called ondansetron  was sent to your pharmacy.  Take this as needed for nausea.  Take over-the-counter Tylenol  and/or ibuprofen  as needed for fevers, aches, pains.  Return to the emergency department for any new or worsening symptoms of concern.

## 2024-07-09 NOTE — ED Triage Notes (Signed)
 Pt BIB [facility] d/t N/V for 1 week.  She is taking Cipro for a Bladder infection and N/V started then.    BP 118/80 HR 124 RR 18 O2 98% Cbg 103

## 2024-07-09 NOTE — ED Provider Notes (Signed)
 [place] EMERGENCY DEPARTMENT AT [facility] Provider Note   CSN: [id number] Arrival date & time: 07/09/24  9952     Patient presents with: Emesis   Cynthia Beasley is a 23 y.o. female.    Emesis Patient presents for nausea and vomiting.  She has no chronic medical conditions.  She was seen urgent care 4 days ago with complaints of sore throat, dysuria.  She was prescribed Keflex  for empiric treatment of UTI.  She has been taking this as prescribed.  For the past 2 days, she describes nausea, vomiting, p.o. intolerance.  Today, she developed shortness of breath and pleuritic back pain, greater on the left side.  She currently denies any areas of discomfort.     Prior to Admission medications   Medication Sig Start Date End Date Taking? Authorizing Provider  cephALEXin  (KEFLEX ) 500 MG capsule Take 1 capsule (500 mg total) by mouth 2 (two) times daily. 07/05/24   Rolinda Rogue, MD  norethindrone  (ORTHO MICRONOR ) 0.35 MG tablet Take 1 tablet (0.35 mg total) by mouth daily. 01/27/21 01/27/22  Firestone, Alicia C, MD  Prenatal Vit-Fe Fumarate-FA (PRENATAL MULTIVITAMIN) TABS tablet Take 1 tablet by mouth daily at 12 noon.    [provider]    Allergies: Patient has no known allergies.    Review of Systems  Respiratory:  Positive for shortness of breath.   Gastrointestinal:  Positive for nausea and vomiting.  Musculoskeletal:  Positive for back pain.  All other systems reviewed and are negative.   Updated Vital Signs BP 98/60   Pulse 88   Temp (!) 100.8 F (38.2 C) (Oral)   Resp 16   Ht 5' 3 (1.6 m)   Wt 54.4 kg   LMP 06/21/2024 (Exact Date)   SpO2 100%   BMI 21.26 kg/m   Physical Exam Vitals and nursing note reviewed.  Constitutional:      General: She is not in acute distress.    Appearance: Normal appearance. She is well-developed. She is not ill-appearing, toxic-appearing or diaphoretic.  HENT:     Head: Normocephalic and atraumatic.     Right Ear:  External ear normal.     Left Ear: External ear normal.     Nose: Nose normal.     Mouth/Throat:     Mouth: Mucous membranes are moist.  Eyes:     Extraocular Movements: Extraocular movements intact.     Conjunctiva/sclera: Conjunctivae normal.  Cardiovascular:     Rate and Rhythm: Regular rhythm. Tachycardia present.  Pulmonary:     Effort: Pulmonary effort is normal. No respiratory distress.  Abdominal:     General: There is no distension.     Palpations: Abdomen is soft.  Musculoskeletal:        General: No swelling. Normal range of motion.     Cervical back: Normal range of motion and neck supple.  Skin:    General: Skin is warm and dry.     Coloration: Skin is not jaundiced or pale.  Neurological:     General: No focal deficit present.     Mental Status: She is alert and oriented to person, place, and time.  Psychiatric:        Mood and Affect: Mood normal.        Behavior: Behavior normal.     (all labs ordered are listed, but only abnormal results are displayed) Labs Reviewed  COMPREHENSIVE METABOLIC PANEL WITH GFR - Abnormal; Notable for the following components:  Result Value   Sodium 134 (*)    CO2 18 (*)    Glucose, Bld 102 (*)    Calcium 8.5 (*)    Alkaline Phosphatase 26 (*)    All other components within normal limits  CBC - Abnormal; Notable for the following components:   WBC 3.9 (*)    All other components within normal limits  URINALYSIS, ROUTINE W REFLEX MICROSCOPIC - Abnormal; Notable for the following components:   APPearance HAZY (*)    Hgb urine dipstick SMALL (*)    Ketones, ur 80 (*)    Protein, ur 100 (*)    Bacteria, UA RARE (*)    All other components within normal limits  RESP PANEL BY RT-PCR (RSV, FLU A&B, COVID)  [id number]  CULTURE, BLOOD (ROUTINE X 2)  CULTURE, BLOOD (ROUTINE X 2)  URINE CULTURE  LIPASE, BLOOD  HCG, SERUM, QUALITATIVE  RAPID HIV SCREEN (HIV 1/2 AB+AG)  I-STAT CG4 LACTIC ACID, ED    EKG: EKG  Interpretation Date/Time:  Monday July 09 2024 01:51:58 EDT Ventricular Rate:  107 PR Interval:  154 QRS Duration:  83 QT Interval:  308 QTC Calculation: 411 R Axis:   76  Text Interpretation: Sinus tachycardia Borderline T abnormalities, inferior leads Baseline wander in lead(s) V1 V2 V5 Confirmed by Melvenia Motto ([id number]) on 07/09/2024 2:57:04 AM  Radiology: CT Angio Chest PE W and/or Wo Contrast Result Date: 07/09/2024 EXAM: CTA CHEST PE WITHOUT AND WITH CONTRAST CT ABDOMEN AND PELVIS WITH CONTRAST 07/09/2024 02:19:00 AM TECHNIQUE: CTA of the chest was performed after the administration of intravenous contrast. Multiplanar reformatted images are provided for review. MIP images are provided for review. CT of the abdomen and pelvis was performed with the administration of intravenous contrast. Automated exposure control, iterative reconstruction, and/or weight based adjustment of the mA/kV was utilized to reduce the radiation dose to as low as reasonably achievable. COMPARISON: None available. CLINICAL HISTORY: Pulmonary embolism (PE) suspected, high prob. Emesis; CT Angio Chest PE W and/or Wo Contrast; Pulmonary embolism (PE) suspected, high prob; CT ABDOMEN PELVIS W CONTRAST; Sepsis. Omni 350 50mL; See ED Notes:; Pt BIB [facility] d/t N/V for 1 week. She is taking Cipro for a Bladder infection and N/V started then. ; ; BP 118/80; HR 124; RR 18; O2 98%; Cbg 103 FINDINGS: CHEST: PULMONARY ARTERIES: Pulmonary arteries are adequately opacified for evaluation. No evidence of pulmonary embolism. MEDIASTINUM: No mediastinal lymphadenopathy. The heart and pericardium demonstrate no acute abnormality. Although not tailored for evaluation of the thoracic aorta, there is no evidence of thoracic aortic aneurysm or dissection. LUNGS AND PLEURA: The lungs are without acute process. No focal consolidation or pulmonary edema. No pleural effusion or pneumothorax. SOFT TISSUES AND BONES: No acute bone or soft tissue abnormality.  ABDOMEN AND PELVIS: LIVER: The liver is unremarkable. GALLBLADDER AND BILE DUCTS: Gallbladder is unremarkable. No biliary ductal dilatation. SPLEEN: Spleen demonstrates no acute abnormality. PANCREAS: Pancreas demonstrates no acute abnormality. ADRENAL GLANDS: Adrenal glands demonstrate no acute abnormality. KIDNEYS, URETERS AND BLADDER: No stones in the kidneys or ureters. No hydronephrosis. No perinephric or periureteral stranding. Mildly thick walled bladder, although underdistended. GI AND BOWEL: Stomach and duodenal sweep demonstrate no acute abnormality. There is no bowel obstruction. No abnormal bowel wall thickening or distension. Normal appendix (image 55). REPRODUCTIVE: Uterus and bilateral ovaries are within normal limits. PERITONEUM AND RETROPERITONEUM: No ascites or free air. LYMPH NODES: No lymphadenopathy. BONES AND SOFT TISSUES: No acute abnormality of the visualized bones. No focal  soft tissue abnormality. IMPRESSION: 1. No evidence of pulmonary embolism. 2. Normal CT chest. 3. Mildly thick walled bladder, correlate for cystitis. Electronically signed by: Pinkie Pebbles MD 07/09/2024 02:29 AM EDT RP Workstation: [id number]   CT ABDOMEN PELVIS W CONTRAST Result Date: 07/09/2024 EXAM: CTA CHEST PE WITHOUT AND WITH CONTRAST CT ABDOMEN AND PELVIS WITH CONTRAST 07/09/2024 02:19:00 AM TECHNIQUE: CTA of the chest was performed after the administration of intravenous contrast. Multiplanar reformatted images are provided for review. MIP images are provided for review. CT of the abdomen and pelvis was performed with the administration of intravenous contrast. Automated exposure control, iterative reconstruction, and/or weight based adjustment of the mA/kV was utilized to reduce the radiation dose to as low as reasonably achievable. COMPARISON: None available. CLINICAL HISTORY: Pulmonary embolism (PE) suspected, high prob. Emesis; CT Angio Chest PE W and/or Wo Contrast; Pulmonary embolism (PE) suspected,  high prob; CT ABDOMEN PELVIS W CONTRAST; Sepsis. Omni 350 50mL; See ED Notes:; Pt BIB [facility] d/t N/V for 1 week. She is taking Cipro for a Bladder infection and N/V started then. ; ; BP 118/80; HR 124; RR 18; O2 98%; Cbg 103 FINDINGS: CHEST: PULMONARY ARTERIES: Pulmonary arteries are adequately opacified for evaluation. No evidence of pulmonary embolism. MEDIASTINUM: No mediastinal lymphadenopathy. The heart and pericardium demonstrate no acute abnormality. Although not tailored for evaluation of the thoracic aorta, there is no evidence of thoracic aortic aneurysm or dissection. LUNGS AND PLEURA: The lungs are without acute process. No focal consolidation or pulmonary edema. No pleural effusion or pneumothorax. SOFT TISSUES AND BONES: No acute bone or soft tissue abnormality. ABDOMEN AND PELVIS: LIVER: The liver is unremarkable. GALLBLADDER AND BILE DUCTS: Gallbladder is unremarkable. No biliary ductal dilatation. SPLEEN: Spleen demonstrates no acute abnormality. PANCREAS: Pancreas demonstrates no acute abnormality. ADRENAL GLANDS: Adrenal glands demonstrate no acute abnormality. KIDNEYS, URETERS AND BLADDER: No stones in the kidneys or ureters. No hydronephrosis. No perinephric or periureteral stranding. Mildly thick walled bladder, although underdistended. GI AND BOWEL: Stomach and duodenal sweep demonstrate no acute abnormality. There is no bowel obstruction. No abnormal bowel wall thickening or distension. Normal appendix (image 55). REPRODUCTIVE: Uterus and bilateral ovaries are within normal limits. PERITONEUM AND RETROPERITONEUM: No ascites or free air. LYMPH NODES: No lymphadenopathy. BONES AND SOFT TISSUES: No acute abnormality of the visualized bones. No focal soft tissue abnormality. IMPRESSION: 1. No evidence of pulmonary embolism. 2. Normal CT chest. 3. Mildly thick walled bladder, correlate for cystitis. Electronically signed by: Pinkie Pebbles MD 07/09/2024 02:29 AM EDT RP Workstation: [id number]      Procedures   Medications Ordered in the ED  ondansetron  (ZOFRAN ) injection 4 mg (has no administration in time range)  acetaminophen  (TYLENOL ) tablet 650 mg (650 mg Oral Given 07/09/24 0125)  lactated ringers  bolus 1,000 mL (0 mLs Intravenous Stopped 07/09/24 0407)  iohexol  (OMNIPAQUE ) 350 MG/ML injection 50 mL (50 mLs Intravenous Contrast Given 07/09/24 0222)                                    Medical Decision Making Amount and/or Complexity of Data Reviewed Labs: ordered. Radiology: ordered.  Risk OTC drugs. Prescription drug management.   This patient presents to the ED for concern of nausea and vomiting, this involves an extensive number of treatment options, and is a complaint that carries with it a high risk of complications and morbidity.  The differential diagnosis includes viral illness, bacterial infection,  medication side effect, GERD, cyclic vomiting, dehydration, metabolic derangements   Co morbidities / Chronic conditions that complicate the patient evaluation  N/A   Additional history obtained:  Additional history obtained from EMR External records from outside source obtained and reviewed including N/A   Lab Tests:  I Ordered, and personally interpreted labs.  The pertinent results include: Normal kidney function, mild metabolic acidosis and ketonuria consistent with starvation ketosis, mild leukopenia consistent with viral syndrome   Imaging Studies ordered:  I ordered imaging studies including CTA chest, CT of abdomen and pelvis I independently visualized and interpreted imaging which showed no acute findings I agree with the radiologist interpretation   Cardiac Monitoring: / EKG:  The patient was maintained on a cardiac monitor.  I personally viewed and interpreted the cardiac monitored which showed an underlying rhythm of: Sinus rhythm   Problem List / ED Course / Critical interventions / Medication management  Patient presenting for  nausea, vomiting, p.o. intolerance.  She also describes a pleuritic back pain that started today.  She has been on Keflex  for the past 4 days for empiric treatment of a UTI.  She states that her dysuria has improved.  She does arrive in the ED today with low-grade fever and tachycardia.  Workup was initiated.  IV fluids ordered for hydration.  As needed Zofran  ordered.  Patient received Tylenol  for antipyresis.  Lab work is notable for metabolic acidosis and ketonuria consistent with starvation ketosis.  She has a mild leukopenia which is suggestive of a viral illness.  She underwent CTA of chest and CT of abdomen pelvis which did not show any acute findings.  Urinalysis today is equivocal.  Urine cultures were sent.  Given patient's improved dysuria, I do not suspect undertreated or recurrent UTI.  On reassessment, patient resting comfortably.  She has had continued resolution of nausea.  She has been able to drink fluids while in the ED.  Patient's recent symptoms more consistent with viral syndrome.  She was advised to continue supportive care at home with p.o. fluids, ibuprofen , Tylenol , and Zofran  as needed.  Zofran  was prescribed.  Patient was discharged in stable condition. I ordered medication including IV fluids for hydration, Tylenol  for antipyresis, Zofran  for nausea Reevaluation of the patient after these medicines showed that the patient improved I have reviewed the patients home medicines and have made adjustments as needed   Social Determinants of Health:  Lives independently      Final diagnoses:  Dehydration  Nausea and vomiting, unspecified vomiting type    ED Discharge Orders     None          Melvenia Motto, MD 07/09/24 [phone]

## 2024-07-09 NOTE — ED Notes (Signed)
 Patient transported to X-ray

## 2024-07-10 LAB — URINE CULTURE: Culture: NO GROWTH

## 2024-07-16 LAB — CULTURE, BLOOD (ROUTINE X 2)
Culture: NO GROWTH
Culture: NO GROWTH
Special Requests: ADEQUATE

## 2024-08-23 ENCOUNTER — Other Ambulatory Visit: Payer: Self-pay

## 2024-08-23 ENCOUNTER — Emergency Department (HOSPITAL_COMMUNITY)
Admission: EM | Admit: 2024-08-23 | Discharge: 2024-08-23 | Disposition: A | Discharge status: Home / Self Care | Attending: Emergency Medicine | Admitting: Emergency Medicine

## 2024-08-23 ENCOUNTER — Emergency Department (HOSPITAL_COMMUNITY)

## 2024-08-23 DIAGNOSIS — R519 Headache, unspecified: Secondary | ICD-10-CM | POA: Diagnosis present

## 2024-08-23 DIAGNOSIS — M542 Cervicalgia: Secondary | ICD-10-CM | POA: Insufficient documentation

## 2024-08-23 DIAGNOSIS — M546 Pain in thoracic spine: Secondary | ICD-10-CM | POA: Insufficient documentation

## 2024-08-23 DIAGNOSIS — M25512 Pain in left shoulder: Secondary | ICD-10-CM | POA: Insufficient documentation

## 2024-08-23 DIAGNOSIS — Y92410 Unspecified street and highway as the place of occurrence of the external cause: Secondary | ICD-10-CM | POA: Insufficient documentation

## 2024-08-23 DIAGNOSIS — M25511 Pain in right shoulder: Secondary | ICD-10-CM | POA: Diagnosis not present

## 2024-08-23 DIAGNOSIS — M7918 Myalgia, other site: Secondary | ICD-10-CM

## 2024-08-23 MED ORDER — KETOROLAC TROMETHAMINE 30 MG/ML IJ SOLN
30.0000 mg | Freq: Once | INTRAMUSCULAR | Status: AC
  Administered 2024-08-23: 30 mg via INTRAMUSCULAR
  Filled 2024-08-23: qty 1

## 2024-08-23 MED ORDER — LIDOCAINE 5 % EX PTCH: 1.0000 | MEDICATED_PATCH | CUTANEOUS | 0 refills | Status: AC

## 2024-08-23 MED ORDER — METHOCARBAMOL 500 MG PO TABS: 500.0000 mg | ORAL_TABLET | Freq: Two times a day (BID) | ORAL | 0 refills | Status: AC

## 2024-08-23 MED ORDER — LIDOCAINE 5 % EX PTCH
2.0000 | MEDICATED_PATCH | CUTANEOUS | Status: DC
  Administered 2024-08-23: 2 via TRANSDERMAL
  Filled 2024-08-23: qty 2
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
## 2024-11-19 ENCOUNTER — Ambulatory Visit: Admitting: Obstetrics and Gynecology

## 2024-11-20 ENCOUNTER — Ambulatory Visit: Admitting: Obstetrics and Gynecology

## 2024-11-26 ENCOUNTER — Ambulatory Visit: Admitting: Obstetrics and Gynecology

## 2024-12-11 ENCOUNTER — Ambulatory Visit: Admitting: Obstetrics

## 2025-01-09 ENCOUNTER — Ambulatory Visit (HOSPITAL_COMMUNITY)
Admission: RE | Admit: 2025-01-09 | Discharge: 2025-01-09 | Disposition: A | Discharge status: Home / Self Care | Source: Ambulatory Visit | Attending: Nurse Practitioner | Admitting: Nurse Practitioner

## 2025-01-09 ENCOUNTER — Encounter (HOSPITAL_COMMUNITY): Payer: Self-pay

## 2025-01-09 VITALS — BP 102/65 | HR 80 | Temp 98.8°F | Resp 14

## 2025-01-09 DIAGNOSIS — N898 Other specified noninflammatory disorders of vagina: Secondary | ICD-10-CM | POA: Diagnosis not present

## 2025-01-09 DIAGNOSIS — Z113 Encounter for screening for infections with a predominantly sexual mode of transmission: Secondary | ICD-10-CM | POA: Insufficient documentation

## 2025-01-09 LAB — HIV ANTIBODY (ROUTINE TESTING W REFLEX): HIV Screen 4th Generation wRfx: NONREACTIVE

## 2025-01-09 NOTE — ED Provider Notes (Signed)
 " [facility]    CSN: [id number] Arrival date & time: 01/09/25  1032      History   Chief Complaint Chief Complaint  Patient presents with   Follow-up    I clicked on follow up because I would like to get a screening done for Sti's/std's along with the hiv screening since it's been some months. - Entered by patient    HPI Cynthia Beasley is a 24 y.o. female.   Patient presents today requesting STD testing.  She endorses vaginal discharge that smells like bread but has been ongoing for an unknown time.  No pelvic pain, fever, nausea/vomiting, vaginal rashes, sores, lesions, fishy vaginal odor, vaginal itching, or known exposures to STI.  No groin swelling.  She denies any urinary symptoms today.    Past Medical History:  Diagnosis Date   Medical history non-contributory     Patient Active Problem List   Diagnosis Date Noted   Normal labor 01/25/2021   Shoulder dystocia during labor and delivery 01/25/2021   Vaginal delivery 01/25/2021   COVID-19 affecting pregnancy in third trimester 01/25/2021   Rh negative state in antepartum period 11/11/2020   Alpha thalassemia silent carrier 08/16/2020   Encounter for supervision of normal pregnancy, unspecified, unspecified trimester 06/27/2020    Past Surgical History:  Procedure Laterality Date   HAND SURGERY Left    HAND SURGERY Left 2019    OB History     Gravida  1   Para  1   Term  1   Preterm      AB      Living  1      SAB      IAB      Ectopic      Multiple  0   Live Births  1            Home Medications    Prior to Admission medications  Medication Sig Start Date End Date Taking? Authorizing Provider  lidocaine  (LIDODERM ) 5 % Place 1 patch onto the skin daily. Remove & Discard patch within 12 hours or as directed by MD Patient not taking: Reported on 01/09/2025 08/23/24   Keith, Kayla N, PA-C  methocarbamol  (ROBAXIN ) 500 MG tablet Take 1 tablet (500 mg total) by mouth 2 (two)  times daily. Patient not taking: Reported on 01/09/2025 08/23/24   Francis Ileana SAILOR, PA-C  norethindrone  (ORTHO MICRONOR ) 0.35 MG tablet Take 1 tablet (0.35 mg total) by mouth daily. Patient not taking: Reported on 01/09/2025 01/27/21 01/27/22  Firestone, Alicia C, MD  ondansetron  (ZOFRAN -ODT) 4 MG disintegrating tablet Take 1 tablet (4 mg total) by mouth every 8 (eight) hours as needed. Patient not taking: Reported on 01/09/2025 07/09/24   Melvenia Motto, MD  Prenatal Vit-Fe Fumarate-FA (PRENATAL MULTIVITAMIN) TABS tablet Take 1 tablet by mouth daily at 12 noon. Patient not taking: Reported on 01/09/2025    [provider]    Family History Family History  Problem Relation Age of Onset   Healthy Mother    Healthy Father     Social History Social History[1]   Allergies   Patient has no known allergies.   Review of Systems Review of Systems Per HPI  Physical Exam Triage Vital Signs ED Triage Vitals [01/09/25 1045]  Encounter Vitals Group     BP 102/65     Girls Systolic BP Percentile      Girls Diastolic BP Percentile      Boys Systolic BP Percentile  Boys Diastolic BP Percentile      Pulse Rate 80     Resp 14     Temp 98.8 F (37.1 C)     Temp Source Oral     SpO2 100 %     Weight      Height      Head Circumference      Peak Flow      Pain Score 0     Pain Loc      Pain Education      Exclude from Growth Chart    No data found.  Updated Vital Signs BP 102/65 (BP Location: Left Arm)   Pulse 80   Temp 98.8 F (37.1 C) (Oral)   Resp 14   LMP 12/18/2024 (Exact Date)   SpO2 100%   Visual Acuity Right Eye Distance:   Left Eye Distance:   Bilateral Distance:    Right Eye Near:   Left Eye Near:    Bilateral Near:     Physical Exam Vitals and nursing note reviewed.  Constitutional:      General: She is not in acute distress.    Appearance: Normal appearance. She is not toxic-appearing.  Pulmonary:     Effort: Pulmonary effort is normal.   Genitourinary:    Comments: Deferred-self swab performed by patient Skin:    General: Skin is warm and dry.     Coloration: Skin is not jaundiced or pale.     Findings: No erythema.  Neurological:     Mental Status: She is alert and oriented to person, place, and time.     Motor: No weakness.     Gait: Gait normal.  Psychiatric:        Mood and Affect: Mood normal.        Behavior: Behavior is cooperative.      UC Treatments / Results  Labs (all labs ordered are listed, but only abnormal results are displayed) Labs Reviewed  HIV ANTIBODY (ROUTINE TESTING W REFLEX)  SYPHILIS: RPR W/REFLEX TO RPR TITER AND TREPONEMAL ANTIBODIES, TRADITIONAL SCREENING AND DIAGNOSIS ALGORITHM  CERVICOVAGINAL ANCILLARY ONLY    EKG   Radiology No results found.  Procedures Procedures (including critical care time)  Medications Ordered in UC Medications - No data to display  Initial Impression / Assessment and Plan / UC Course  I have reviewed the triage vital signs and the nursing notes.  Pertinent labs & imaging results that were available during my care of the patient were reviewed by me and considered in my medical decision making (see chart for details).   Patient is a pleasant, well-appearing 24 year old female presenting today for STD testing.  Self swab is pending-treat as indicated.  HIV and syphilis testing also pending per patient request.  Safe sex practices discussed with patient.  The patient was given the opportunity to ask questions.  All questions answered to their satisfaction.  The patient is in agreement to this plan.   Final Clinical Impressions(s) / UC Diagnoses   Final diagnoses:  Vaginal discharge  Screening examination for STD (sexually transmitted disease)     Discharge Instructions      We will contact you later this week if any of the testing comes back positive.  In the meantime, please refrain from sexual activity until you are aware of your results.   Recommend condom use with every sexual counter to prevent STI.    ED Prescriptions   None    PDMP not reviewed this encounter.    [  1]  Social History Tobacco Use   Smoking status: Passive Smoke Exposure - Never Smoker   Smokeless tobacco: Never  Vaping Use   Vaping status: Never Used  Substance Use Topics   Alcohol use: Never   Drug use: Not Currently    Types: Marijuana     Chandra Harlene LABOR, NP 01/09/25 1111  "

## 2025-01-09 NOTE — ED Triage Notes (Signed)
 Patient is requesting STD testing and blood work..  Patient denies any symptoms.

## 2025-01-09 NOTE — Discharge Instructions (Signed)
 We will contact you later this week if any of the testing comes back positive.  In the meantime, please refrain from sexual activity until you are aware of your results.  Recommend condom use with every sexual counter to prevent STI.

## 2025-01-10 LAB — CERVICOVAGINAL ANCILLARY ONLY
Chlamydia: NEGATIVE
Comment: NEGATIVE
Comment: NEGATIVE
Comment: NORMAL
Neisseria Gonorrhea: NEGATIVE
Trichomonas: NEGATIVE

## 2025-01-10 LAB — SYPHILIS: RPR W/REFLEX TO RPR TITER AND TREPONEMAL ANTIBODIES, TRADITIONAL SCREENING AND DIAGNOSIS ALGORITHM: RPR Ser Ql: NONREACTIVE

## 2025-02-19 ENCOUNTER — Ambulatory Visit: Admitting: Obstetrics and Gynecology
